# Patient Record
Sex: Female | Born: 1950 | Race: White | Hispanic: No | Marital: Married | Smoking: Current some day smoker
Health system: Southern US, Community
[De-identification: ages and names within clinical notes are randomized; demographics above are authoritative.]

## PROBLEM LIST (undated history)

## (undated) DIAGNOSIS — S92909A Unspecified fracture of unspecified foot, initial encounter for closed fracture: Secondary | ICD-10-CM

## (undated) DIAGNOSIS — E039 Hypothyroidism, unspecified: Secondary | ICD-10-CM

## (undated) DIAGNOSIS — E7800 Pure hypercholesterolemia, unspecified: Secondary | ICD-10-CM

## (undated) DIAGNOSIS — I471 Supraventricular tachycardia, unspecified: Secondary | ICD-10-CM

## (undated) DIAGNOSIS — K5120 Ulcerative (chronic) proctitis without complications: Secondary | ICD-10-CM

## (undated) DIAGNOSIS — J309 Allergic rhinitis, unspecified: Secondary | ICD-10-CM

## (undated) DIAGNOSIS — I10 Essential (primary) hypertension: Secondary | ICD-10-CM

## (undated) DIAGNOSIS — F17200 Nicotine dependence, unspecified, uncomplicated: Secondary | ICD-10-CM

## (undated) DIAGNOSIS — M1990 Unspecified osteoarthritis, unspecified site: Secondary | ICD-10-CM

## (undated) DIAGNOSIS — D649 Anemia, unspecified: Secondary | ICD-10-CM

## (undated) DIAGNOSIS — R079 Chest pain, unspecified: Secondary | ICD-10-CM

## (undated) HISTORY — PX: THYROIDECTOMY: SHX17

## (undated) HISTORY — DX: Hypothyroidism, unspecified: E03.9

## (undated) HISTORY — DX: Chest pain, unspecified: R07.9

## (undated) HISTORY — DX: Supraventricular tachycardia: I47.1

## (undated) HISTORY — DX: Ulcerative (chronic) proctitis without complications: K51.20

## (undated) HISTORY — DX: Pure hypercholesterolemia, unspecified: E78.00

## (undated) HISTORY — DX: Allergic rhinitis, unspecified: J30.9

## (undated) HISTORY — DX: Supraventricular tachycardia, unspecified: I47.10

## (undated) HISTORY — DX: Nicotine dependence, unspecified, uncomplicated: F17.200

## (undated) HISTORY — DX: Essential (primary) hypertension: I10

## (undated) HISTORY — DX: Unspecified fracture of unspecified foot, initial encounter for closed fracture: S92.909A

## (undated) HISTORY — PX: CATARACT EXTRACTION: SUR2

---
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
## 1985-12-13 HISTORY — PX: TUBAL LIGATION: SHX77

## 1998-08-04 ENCOUNTER — Other Ambulatory Visit: Admission: RE | Admit: 1998-08-04 | Discharge: 1998-08-04 | Payer: Self-pay | Admitting: Gastroenterology

## 1999-01-22 ENCOUNTER — Other Ambulatory Visit: Admission: RE | Admit: 1999-01-22 | Discharge: 1999-01-22 | Payer: Self-pay | Admitting: Family Medicine

## 2000-12-13 DIAGNOSIS — S92909A Unspecified fracture of unspecified foot, initial encounter for closed fracture: Secondary | ICD-10-CM

## 2000-12-13 HISTORY — DX: Unspecified fracture of unspecified foot, initial encounter for closed fracture: S92.909A

## 2001-02-15 ENCOUNTER — Encounter: Admission: RE | Admit: 2001-02-15 | Discharge: 2001-02-15 | Payer: Self-pay | Admitting: Family Medicine

## 2001-02-15 ENCOUNTER — Encounter: Payer: Self-pay | Admitting: Family Medicine

## 2002-04-18 ENCOUNTER — Other Ambulatory Visit: Admission: RE | Admit: 2002-04-18 | Discharge: 2002-04-18 | Payer: Self-pay | Admitting: Obstetrics and Gynecology

## 2002-07-05 ENCOUNTER — Encounter: Payer: Self-pay | Admitting: Family Medicine

## 2002-07-05 ENCOUNTER — Encounter: Admission: RE | Admit: 2002-07-05 | Discharge: 2002-07-05 | Payer: Self-pay | Admitting: Family Medicine

## 2003-07-09 ENCOUNTER — Encounter: Admission: RE | Admit: 2003-07-09 | Discharge: 2003-07-09 | Payer: Self-pay | Admitting: Family Medicine

## 2003-07-09 ENCOUNTER — Encounter: Payer: Self-pay | Admitting: Family Medicine

## 2003-10-21 ENCOUNTER — Ambulatory Visit (HOSPITAL_COMMUNITY): Admission: RE | Admit: 2003-10-21 | Discharge: 2003-10-21 | Payer: Self-pay | Admitting: Family Medicine

## 2003-10-22 ENCOUNTER — Ambulatory Visit (HOSPITAL_COMMUNITY): Admission: RE | Admit: 2003-10-22 | Discharge: 2003-10-22 | Payer: Self-pay | Admitting: *Deleted

## 2003-12-14 HISTORY — PX: OOPHORECTOMY: SHX86

## 2003-12-17 ENCOUNTER — Ambulatory Visit: Admission: RE | Admit: 2003-12-17 | Discharge: 2003-12-17 | Payer: Self-pay | Admitting: Gynecology

## 2003-12-24 ENCOUNTER — Encounter (INDEPENDENT_AMBULATORY_CARE_PROVIDER_SITE_OTHER): Payer: Self-pay | Admitting: Specialist

## 2003-12-24 ENCOUNTER — Ambulatory Visit (HOSPITAL_COMMUNITY): Admission: RE | Admit: 2003-12-24 | Discharge: 2003-12-24 | Payer: Self-pay | Admitting: Gynecology

## 2004-01-21 ENCOUNTER — Ambulatory Visit: Admission: RE | Admit: 2004-01-21 | Discharge: 2004-01-21 | Payer: Self-pay | Admitting: Gynecology

## 2004-07-13 ENCOUNTER — Other Ambulatory Visit: Admission: RE | Admit: 2004-07-13 | Discharge: 2004-07-13 | Payer: Self-pay | Admitting: Family Medicine

## 2005-01-21 ENCOUNTER — Ambulatory Visit (HOSPITAL_COMMUNITY): Admission: RE | Admit: 2005-01-21 | Discharge: 2005-01-21 | Payer: Self-pay | Admitting: Family Medicine

## 2005-01-28 ENCOUNTER — Ambulatory Visit: Payer: Self-pay | Admitting: Gastroenterology

## 2005-02-08 ENCOUNTER — Ambulatory Visit: Payer: Self-pay | Admitting: Gastroenterology

## 2006-02-07 ENCOUNTER — Ambulatory Visit (HOSPITAL_COMMUNITY): Admission: RE | Admit: 2006-02-07 | Discharge: 2006-02-07 | Payer: Self-pay | Admitting: Obstetrics and Gynecology

## 2007-09-13 ENCOUNTER — Other Ambulatory Visit: Admission: RE | Admit: 2007-09-13 | Discharge: 2007-09-13 | Payer: Self-pay | Admitting: Family Medicine

## 2008-01-22 ENCOUNTER — Ambulatory Visit (HOSPITAL_COMMUNITY): Admission: RE | Admit: 2008-01-22 | Discharge: 2008-01-22 | Payer: Self-pay | Admitting: Family Medicine

## 2008-09-09 ENCOUNTER — Emergency Department (HOSPITAL_COMMUNITY): Admission: EM | Admit: 2008-09-09 | Discharge: 2008-09-09 | Payer: Self-pay | Admitting: Emergency Medicine

## 2008-09-16 ENCOUNTER — Encounter: Admission: RE | Admit: 2008-09-16 | Discharge: 2008-09-16 | Payer: Self-pay | Admitting: Cardiology

## 2008-12-17 ENCOUNTER — Encounter: Payer: Self-pay | Admitting: Gastroenterology

## 2009-05-26 ENCOUNTER — Telehealth: Payer: Self-pay | Admitting: Gastroenterology

## 2009-07-09 DIAGNOSIS — K5190 Ulcerative colitis, unspecified, without complications: Secondary | ICD-10-CM

## 2009-07-16 ENCOUNTER — Ambulatory Visit: Payer: Self-pay | Admitting: Gastroenterology

## 2009-07-16 DIAGNOSIS — K625 Hemorrhage of anus and rectum: Secondary | ICD-10-CM

## 2009-07-16 DIAGNOSIS — E039 Hypothyroidism, unspecified: Secondary | ICD-10-CM | POA: Insufficient documentation

## 2009-07-16 DIAGNOSIS — K219 Gastro-esophageal reflux disease without esophagitis: Secondary | ICD-10-CM

## 2009-07-16 DIAGNOSIS — R000 Tachycardia, unspecified: Secondary | ICD-10-CM

## 2009-08-11 ENCOUNTER — Ambulatory Visit: Payer: Self-pay | Admitting: Gastroenterology

## 2009-12-13 DIAGNOSIS — M8580 Other specified disorders of bone density and structure, unspecified site: Secondary | ICD-10-CM

## 2009-12-13 HISTORY — DX: Other specified disorders of bone density and structure, unspecified site: M85.80

## 2010-01-13 ENCOUNTER — Ambulatory Visit (HOSPITAL_COMMUNITY): Admission: RE | Admit: 2010-01-13 | Discharge: 2010-01-13 | Payer: Self-pay | Admitting: Family Medicine

## 2010-01-21 ENCOUNTER — Encounter: Admission: RE | Admit: 2010-01-21 | Discharge: 2010-01-21 | Payer: Self-pay | Admitting: Family Medicine

## 2011-04-27 ENCOUNTER — Other Ambulatory Visit (HOSPITAL_COMMUNITY)
Admission: RE | Admit: 2011-04-27 | Discharge: 2011-04-27 | Disposition: A | Discharge status: Home / Self Care | Payer: BC Managed Care – PPO | Source: Ambulatory Visit | Attending: Family Medicine | Admitting: Family Medicine

## 2011-04-27 ENCOUNTER — Other Ambulatory Visit: Payer: Self-pay | Admitting: Family Medicine

## 2011-04-27 DIAGNOSIS — Z124 Encounter for screening for malignant neoplasm of cervix: Secondary | ICD-10-CM | POA: Insufficient documentation

## 2011-04-30 NOTE — Op Note (Signed)
NAME:  Jennifer Sexton, Jennifer Sexton                         ACCOUNT NO.:  [id number]   MEDICAL RECORD NO.:  [id number]                   PATIENT TYPE:  AMB   LOCATION:  DAY                                  FACILITY:  [facility]   PHYSICIAN:  De Blanch, M.D.         DATE OF BIRTH:  29-Mar-1951   DATE OF PROCEDURE:  12/24/2003  DATE OF DISCHARGE:                                 OPERATIVE REPORT   PREOPERATIVE DIAGNOSIS:  Pelvic pain with pelvic mass.   POSTOPERATIVE DIAGNOSIS:  Right ovarian cyst with partial torsion (benign).   OPERATION/PROCEDURE:  Laparoscopic right salpingo-oophorectomy.   SURGEON:  De Blanch, M.D.   ASSISTANT:  Telford Nab, R.N.   ANESTHESIA:  General with orotracheal tube.   ESTIMATED BLOOD LOSS:  25 mL.   SURGICAL FINDINGS:  At the time of laparoscopy, the upper abdomen was  evaluated.  The diaphragm, liver, gallbladder, and stomach appeared normal.  The omentum was normal although there were some adhesions just to the left  of the umbilicus.  In the pelvis, the uterus and left tube and ovary  appeared normal.  The right ovary had a large cyst arising from it,  measuring approximately 9 cm in diameter.  This was not adherent to any  surfaces although there was some torsion of the tube and vascular pedicle.  Frozen section returned showing this to be a benign ovarian cyst.   DESCRIPTION OF PROCEDURE:  The patient was brought to the operating room and  after satisfactory attainment of general anesthesia, placed in a modified  lithotomy position in [place] stirrups.  The anterior abdominal wall, perineum  and vagina were prepped with Betadine.  A Foley catheter was inserted and a  Hulka tenaculum was placed in the uterus and cervix.  The patient was  draped.  The abdomen was entered through an open laparoscopy incision in the  umbilicus with care taken to avoid injury to the bowel.  Adhesions to the  left of the incision were identified and  respected.  A laparoscopic cannula  and laparoscope were inserted and the abdomen insufflated with carbon  dioxide.  The upper abdomen was explored and the patient was placed in steep  Trendelenburg.  The bowel was moved out of pelvis until the pelvic  structures could be identified.  Photographs were taken.  The right pelvic  sidewall was opened, identifying the external iliac artery and vein and the  ureter.  The ovarian vessels were skeletonized, peritoneum was incised just  beneath the ovarian vessels.  A linear stapler was inserted through the  suprapubic cannula and the ovarian vessels divided and ligated with the  linear stapler.  The posterior broad ligament was then incised beneath the  large cyst until we were adjacent to the uterus.  Another stapler was then  applied across the proximal fallopian tube and uterine ovarian anastomosis  and ovarian ligaments.  This was fired thus freeing the tube and ovary and  cyst from any attachments.  In order to remove this cyst intact, a large  EndoCatch bag (15 cm) was inserted through the suprapubic port.  The cyst  was placed in the bag intact and the bag brought to the surface of the skin.  The cyst was then drained using a spinal needle for approximately 240 mL of  straw-colored fluid.  The cyst was then removed inside the bag and submitted  for frozen section which returned this to be a benign ovarian cyst.  The  intraperitoneal pressure was dropped to 5 mmHg and the pedicles reinspected  and were found to be hemostatic.  The suprapubic port fascial incision was  then closed using a Grice needle and 0 Vicryl in a figure-of-eight fashion.  Once this was tied, the fascia and peritoneum were closed and this was an  airtight closure.  The lateral trocars were removed under direct  visualization as was the umbilical trocar.  The umbilical incision was  closed with interrupted sutures of 0 Vicryl.  Skin incisions in all four  ports were closed  with subcuticular sutures of 3-0 Vicryl.  Steri-Strips  were applied.  Dressings were applied.  The patient was awakened from  anesthesia and taken to the recovery room in satisfactory condition.  Sponge, needle and instrument counts were correct x2.                                               De Blanch, M.D.    DC/MEDQ  D:  12/24/2003  T:  12/24/2003  Job:  [id number]   cc:   Guy Sandifer. Arleta Creek, M.D.  [address]  [place]  [place] [address]  Fax: [phone]   Telford Nab, R.N.  [phone] [address]. [address]  [place], [place] [address]

## 2011-04-30 NOTE — Consult Note (Signed)
NAME:  Jennifer Sexton, Jennifer Sexton                         ACCOUNT NO.:  [id number]   MEDICAL RECORD NO.:  [id number]                   PATIENT TYPE:  OUT   LOCATION:  GYN                                  FACILITY:  [facility]   PHYSICIAN:  De Blanch, M.D.         DATE OF BIRTH:  [birth year]   DATE OF CONSULTATION:  12/17/2003  DATE OF DISCHARGE:                                   CONSULTATION   This is a 60 year old white female seen at the request of Dr. Harold Hedge  for management of an ovarian cyst.  The patient initially presented to Dr.  Henderson Cloud after an acute episode of abdominal pain in November of 2004.  This  resolved after approximately 24 hours, and the patient in retrospect thinks  this may have been food poisoning.  In the evaluation of the pain, the  patient underwent a CT scan of the abdomen and pelvis which revealed a  cystic lesion in the right posteroinferior pelvis measuring 8.7 x 7.2 cm.  It was also thought she had a solid mass measuring approximately 3.9 x 3.1  cm in the right fundal portion of the uterus.  Subsequently, the patient  underwent an ultrasound which showed an 8 x 5.4 x 7.3 simple cyst of the  right ovary and a somewhat smaller 1.2 x 2.2 x 1.3 cm irregular cyst of the  right ovary as well.  The left ovary, endometrial stripe, and uterus  appeared normal.   The patient currently denies any pelvic pain or pressure.  She is menopausal  and has not had any bleeding.   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  2. Arthritis.   PAST SURGICAL HISTORY:  1. Partial thyroidectomy in 1977.  2. Bilateral tubal ligation.   DRUG ALLERGIES:  None.   FAMILY HISTORY:  Negative for gynecologic, breast, or colon cancer.   CURRENT MEDICATIONS:  Synthroid and MetroCream for acne rosacea.   SOCIAL HISTORY:  The patient smokes approximately 3 cigarettes a day.  She  works as a [employer] manager.   OBSTETRICAL  HISTORY:  Gravida 2.   REVIEW OF SYSTEMS:  Negative.   PHYSICAL EXAM:  VITAL SIGNS:  Height 5 feet 5, weight 158 pounds, blood  pressure 142/80, pulse 100, respiratory rate 18.  GENERAL:  The patient is a healthy white female in no acute distress.  HEENT:  Negative.  NECK:  Supple.  Without thyromegaly.  There is no supraclavicular or  inguinal adenopathy.  ABDOMEN:  Soft, nontender.  No masses, organomegaly, ascites, or hernias are  noted.  PELVIC:  EGBUS, vagina, bladder, urethra are normal.  Cervix is normal.  Uterus is anterior, normal shape, size, and consistency.  In the right  posterior pelvis is a cystic mass measuring approximately 7 cm in diameter.  RECTOVAGINAL:  Confirms.  The mass is nontender.   IMPRESSION:  Large simple ovarian cyst, most likely  benign cystadenoma or  possibly even a paratubal, parovarian cyst.   The patient is desirous of having the least invasive surgery possible.  Therefore, I would recommend that we approach with the laparoscope in hopes  of being able to remove it intact in an Endobag and then obtain frozen  section.  The patient is aware that if we cannot remove this with the  laparoscope we will proceed with laparotomy.  She is also aware of the  extensive surgery should this be found to be a malignancy.   Finally, the patient wishes to preserve her contralateral (left) ovary as  she believes it may be contributing somewhat to some hormone benefit, and  she is fearful that she may develop hot flushes if the other ovary is  removed.   Surgery is scheduled for December 24, 2003.                                               De Blanch, M.D.    DC/MEDQ  D:  12/18/2003  T:  12/18/2003  Job:  [id number]   cc:   Guy Sandifer. Arleta Creek, M.D.  [address]  [place]  [place] [address]  Fax: [phone]   Telford Nab, R.N.  [phone] [address]. [address]  [place], [place] [address]

## 2011-04-30 NOTE — Consult Note (Signed)
NAME:  Jennifer Sexton, Jennifer Sexton                         ACCOUNT NO.:  [id number]   MEDICAL RECORD NO.:  [id number]                   PATIENT TYPE:  OUT   LOCATION:  GYN                                  FACILITY:  [facility]   PHYSICIAN:  De Blanch, M.D.         DATE OF BIRTH:  December 25, 1950   DATE OF CONSULTATION:  01/21/2004  DATE OF DISCHARGE:                                   CONSULTATION   REASON FOR CONSULTATION:  A 60 year old white female returns for  postoperative check having undergone a laparoscopically assisted right  salpingo-oophorectomy on December 24, 2003.  Final pathology showed the  ovarian cyst to be a serous cyst adenofibroma.  She had an uncomplicated  postoperative course.  Currently, she has no GI or GU symptoms, has no  pelvic pain, pressure, vaginal bleeding, or discharge.   PHYSICAL EXAMINATION:  ABDOMEN:  Soft and nontender.  All laparoscopic  incisions are healing well.  No masses, organomegaly, ascites, or hernias  are noted.  PELVIC:  EGBUS, vagina, bladder, and urethra are normal.  Cervix is normal.  Uterus is anterior, normal size, shape, and consistency.  There are no  adnexal masses.   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  The patient had  desired to keep her other ovary and uterus in situ.  She seems to be doing  well.  We have given her the okay to return to full levels of activity.  She  will return to the care of her primary gynecologist, Dr. Henderson Cloud, and her  primary physician, Dr. Vianne Bulls.                                               De Blanch, M.D.    DC/MEDQ  D:  01/21/2004  T:  01/21/2004  Job:  [id number]   cc:   Guy Sandifer. Arleta Creek, M.D.  [address]  [place]  [place] [address]  Fax: [phone]   C. Duane Lope, M.D.  [address]  [place]  [place] [address]  Fax: [phone]   Telford Nab, R.N.  [phone] [address]  [place], [place] [address]

## 2011-09-13 LAB — POCT I-STAT, CHEM 8
BUN: 7
Calcium, Ion: 1.17
Chloride: 104
Creatinine, Ser: 0.9
Glucose, Bld: 110 — ABNORMAL HIGH
HCT: 44
Hemoglobin: 15
Potassium: 4
Sodium: 142
TCO2: 27

## 2011-12-14 DIAGNOSIS — C801 Malignant (primary) neoplasm, unspecified: Secondary | ICD-10-CM

## 2011-12-14 HISTORY — DX: Malignant (primary) neoplasm, unspecified: C80.1

## 2011-12-14 HISTORY — PX: ROOT CANAL: SHX2363

## 2012-04-17 ENCOUNTER — Other Ambulatory Visit (HOSPITAL_COMMUNITY): Payer: Self-pay | Admitting: Family Medicine

## 2012-04-17 DIAGNOSIS — Z1231 Encounter for screening mammogram for malignant neoplasm of breast: Secondary | ICD-10-CM

## 2012-04-19 ENCOUNTER — Ambulatory Visit (HOSPITAL_COMMUNITY)
Admission: RE | Admit: 2012-04-19 | Discharge: 2012-04-19 | Disposition: A | Discharge status: Home / Self Care | Payer: BC Managed Care – PPO | Source: Ambulatory Visit | Attending: Family Medicine | Admitting: Family Medicine

## 2012-04-19 DIAGNOSIS — Z1231 Encounter for screening mammogram for malignant neoplasm of breast: Secondary | ICD-10-CM

## 2012-12-27 ENCOUNTER — Other Ambulatory Visit: Payer: BC Managed Care – PPO

## 2012-12-27 ENCOUNTER — Other Ambulatory Visit: Payer: Self-pay | Admitting: Family Medicine

## 2012-12-27 DIAGNOSIS — M545 Low back pain: Secondary | ICD-10-CM

## 2013-01-03 ENCOUNTER — Ambulatory Visit
Admission: RE | Admit: 2013-01-03 | Discharge: 2013-01-03 | Disposition: A | Discharge status: Home / Self Care | Payer: BC Managed Care – PPO | Source: Ambulatory Visit | Attending: Family Medicine | Admitting: Family Medicine

## 2013-01-03 DIAGNOSIS — M545 Low back pain: Secondary | ICD-10-CM

## 2013-01-04 ENCOUNTER — Ambulatory Visit: Payer: BC Managed Care – PPO | Attending: Family Medicine

## 2013-01-04 DIAGNOSIS — M25569 Pain in unspecified knee: Secondary | ICD-10-CM | POA: Insufficient documentation

## 2013-01-04 DIAGNOSIS — R5381 Other malaise: Secondary | ICD-10-CM | POA: Insufficient documentation

## 2013-01-04 DIAGNOSIS — M545 Low back pain, unspecified: Secondary | ICD-10-CM | POA: Insufficient documentation

## 2013-01-04 DIAGNOSIS — IMO0001 Reserved for inherently not codable concepts without codable children: Secondary | ICD-10-CM | POA: Insufficient documentation

## 2013-01-08 ENCOUNTER — Ambulatory Visit: Payer: BC Managed Care – PPO | Admitting: Physical Therapy

## 2013-01-11 ENCOUNTER — Ambulatory Visit: Payer: BC Managed Care – PPO | Admitting: Physical Therapy

## 2013-01-15 ENCOUNTER — Ambulatory Visit: Payer: BC Managed Care – PPO | Admitting: Physical Therapy

## 2013-01-15 ENCOUNTER — Encounter: Payer: BC Managed Care – PPO | Admitting: Physical Therapy

## 2013-01-18 ENCOUNTER — Encounter: Payer: BC Managed Care – PPO | Admitting: Physical Therapy

## 2013-01-22 ENCOUNTER — Encounter: Payer: BC Managed Care – PPO | Admitting: Physical Therapy

## 2013-01-25 ENCOUNTER — Encounter: Payer: BC Managed Care – PPO | Admitting: Physical Therapy

## 2013-01-29 ENCOUNTER — Encounter: Payer: BC Managed Care – PPO | Admitting: Physical Therapy

## 2013-02-01 ENCOUNTER — Encounter: Payer: BC Managed Care – PPO | Admitting: Physical Therapy

## 2013-02-05 ENCOUNTER — Encounter: Payer: BC Managed Care – PPO | Admitting: Physical Therapy

## 2013-02-08 ENCOUNTER — Encounter: Payer: BC Managed Care – PPO | Admitting: Physical Therapy

## 2013-02-20 ENCOUNTER — Other Ambulatory Visit: Payer: Self-pay | Admitting: Nurse Practitioner

## 2013-02-20 DIAGNOSIS — C549 Malignant neoplasm of corpus uteri, unspecified: Secondary | ICD-10-CM | POA: Insufficient documentation

## 2013-03-09 ENCOUNTER — Encounter: Payer: Self-pay | Admitting: Gynecologic Oncology

## 2013-03-12 ENCOUNTER — Ambulatory Visit: Payer: BC Managed Care – PPO | Admitting: Gynecology

## 2013-03-12 ENCOUNTER — Telehealth: Payer: Self-pay | Admitting: Gynecologic Oncology

## 2013-03-12 NOTE — Telephone Encounter (Signed)
Consult Note: Gyn-Onc  Consult was requested by Dr.  for the evaluation of Coda W Reder 62 y.o. female  CC: Grade 1 endometrial cancer   Assessment/Plan:  Ms. MARIELENA HARVELL  is a 62 y.o.  year old   Endometrium, biopsy - ENDOMETRIOID ADENOCARCINOMA. Microscopic Comment Although grade is best determined at time of surgical excision, the adenocarcinoma appears FIGO grade I.  HPI:   Current Meds:  Outpatient Encounter Prescriptions as of 03/12/2013  Medication Sig Dispense Refill  . Calcium Carbonate-Vitamin D (CALCIUM + D PO) Take by mouth daily.      Marland Kitchen diltiazem (DILACOR XR) 180 MG 24 hr capsule Take 180 mg by mouth daily.      Marland Kitchen levothyroxine (SYNTHROID, LEVOTHROID) 100 MCG tablet Take 100 mcg by mouth daily. Takes 1/2 on Monday       No facility-administered encounter medications on file as of 03/12/2013.    Allergy: No Known Allergies  Social Hx:   History   Social History  . Marital Status: Married    Spouse Name: N/A    Number of Children: N/A  . Years of Education: N/A   Occupational History  . Not on file.   Social History Main Topics  . Smoking status: Current Every Day Smoker -- 47 years  . Smokeless tobacco: Not on file  . Alcohol Use: Yes     Comment: 2 a day  . Drug Use: No  . Sexually Active: Not on file   Other Topics Concern  . Not on file   Social History Narrative  . No narrative on file    Past Surgical Hx:  Past Surgical History  Procedure Laterality Date  . Thyroidectomy    BSO R ovarian fibroma left ovarian serous cyst.2005  Past Medical Hx:  Past Medical History  Diagnosis Date  . Chest pain   . Paroxysmal supraventricular tachycardia   . Tobacco dependence   . Ulcerative proctitis   . Hypothyroidism   . Allergic rhinitis   . Elevated LDL cholesterol level   . Hypertension     Past Gynecological History:   Family Hx: No family history on file.  Review of Systems:  Constitutional  Feels well,   Skin/Breast  No  rash, sores, jaundice, itching, dryness Cardiovascular  No chest pain, shortness of breath, or edema  Pulmonary  No cough or wheeze.  Gastro Intestinal  No nausea, vomitting, or diarrhoea. No bright red blood per rectum, no abdominal pain, change in bowel movement, or constipation.  Genito Urinary  No frequency, urgency, dysuria,  Musculo Skeletal  No myalgia, arthralgia, joint swelling or pain  Neurologic  No weakness, numbness, change in gait,  Psychology  No depression, anxiety, insomnia.   Vitals:  @v @  Physical Exam: WD in NAD Neck  Supple NROM, without any enlargements.  Lymph Node Survey No cervical supraclavicular or inguinal adenopathy Cardiovascular  Pulse normal rate, regularity and rhythm. S1 and S2 normal.  Lungs  Clear to auscultation bilateraly, without wheezes/crackles/rhonchi. Good air movement.  Skin  No rash/lesions/breakdown  Psychiatry  Alert and oriented to person, place, and time  Abdomen  Normoactive bowel sounds, abdomen soft, non-tender and obese. Surgical  sites intact without evidence of hernia.  Back No CVA tenderness Genito Urinary  Vulva/vagina: Normal external female genitalia.  No lesions. No discharge or bleeding.  Bladder/urethra:  No lesions or masses  Vagina:   Cervix: Normal appearing, no lesions.  Uterus: Small, mobile, no parametrial involvement or nodularity.  Adnexa:  No palpable masses. Rectal  Good tone, no masses no cul de sac nodularity.  Extremities  No bilateral cyanosis, clubbing or edema.   Laurette Schimke, MD, PhD 03/12/2013, 9:06 PM

## 2013-03-13 ENCOUNTER — Encounter: Payer: Self-pay | Admitting: Gynecologic Oncology

## 2013-03-13 ENCOUNTER — Ambulatory Visit: Payer: BC Managed Care – PPO | Attending: Gynecologic Oncology | Admitting: Gynecologic Oncology

## 2013-03-13 VITALS — BP 112/70 | HR 64 | Temp 98.6°F | Resp 16 | Ht 64.29 in | Wt 163.6 lb

## 2013-03-13 DIAGNOSIS — C549 Malignant neoplasm of corpus uteri, unspecified: Secondary | ICD-10-CM

## 2013-03-13 DIAGNOSIS — C541 Malignant neoplasm of endometrium: Secondary | ICD-10-CM

## 2013-03-13 NOTE — Progress Notes (Signed)
Consult Note: Gyn-Onc  Consult was requested by Dr. Ermalinda Memos for the evaluation of Jennifer Sexton 62 y.o. female  CC: Grade 1 endometrial cancer   Assessment/Plan: [name] is a 62 year old gravida 2 para 2 with an endometrial biopsy indicating the presence of a grade 1 endometrioid adenocarcinoma. An ultrasound obtained  The same day as the endometrial biopsy demonstrated a uterus 7.8 cm in length.   A detailed discussion was held with the patient  with regard to to her endometrial cancer diagnosis. We discussed the standard management options for uterine cancer which includes surgery followed possibly by adjuvant therapy depending on the results of surgery. The options for surgical management include a hysterectomy and removal of the tubes and ovaries possibly with removal of pelvic lymph nodes. A minimally invasive approach including a robotic hysterectomy or laparoscopic hysterectomy have benefits including shorter hospital stay, recovery time and better wound healing. The alternative approach is an open hysterectomy. The patient has been counseled about these surgical options and the risks of surgery in general including infection, bleeding, damage to surrounding structures (including bowel, bladder, ureters, nerves or vessels), and the postoperative risks of PE/ DVT, and lymphedema. I extensively reviewed the additional risks of robotic hysterectomy including possible need for conversion to open laparotomy.  I discussed positioning during surgery of trendelenberg and risks of minor facial swelling and care we take in preoperative positioning.  After counseling and consideration of her options, she desires to proceed with robotic endometrial cancer staging. The endometrial cancer specimen will be sent for microsatellite instability and IHC staining particularly given her family history.  The patient is aware that she will be contacted regarding a surgery date and that either I or Dr.  Cleda Mccreedy be her surgeon. At her preference of the procedure be performed after April 15.  She will be seen by anesthesia for preoperative clearance and discussion of postoperative pain management.  She was given the opportunity to ask questions, which were answered to her satisfaction, and she is agreement with the above mentioned plan of care.   HPI: Jennifer Sexton  is a 63 y.o.  year old who in February of 2014 noted vaginal bleeding. An ultrasound was performed on 02/20/2013 and this demonstrated a uterus that measures 7.3 cm in length. The endometrium was noted to be thickened and irregular in appearance and measuring 1.9 cm. She underwent an endometrial biopsy on 02/21/2012   Endometrium, biopsy - ENDOMETRIOID ADENOCARCINOMA. Microscopic Comment Although grade is best determined at time of surgical excision, the adenocarcinoma appears FIGO grade I.  Ms. Bookwalter reports occasional vaginal discharge. She denies any weight loss but does report some abdominal tightness and bloating. Reports 2 episodes of diarrhea but denies any recent weight loss changes in her bowel or rectal habits or hematuria.  Her history is remarkable for a father who was diagnosed with colon cancer the age of 74 and died one year later.   Current Meds:  Outpatient Encounter Prescriptions as of 03/13/2013  Medication Sig Dispense Refill  . Calcium Carbonate-Vitamin D (CALCIUM + D PO) Take by mouth daily.      Marland Kitchen diltiazem (DILACOR XR) 180 MG 24 hr capsule Take 180 mg by mouth daily.      Marland Kitchen levothyroxine (SYNTHROID, LEVOTHROID) 100 MCG tablet Take 100 mcg by mouth daily. Takes 1/2 on Monday       No facility-administered encounter medications on file as of 03/13/2013.    Allergy: No Known Allergies  Social Hx:   History   Social History  . Marital Status: Married    Spouse Name: N/A    Number of Children: N/A  . Years of Education: N/A   Occupational History  . Not on file.   Social History Main Topics   . Smoking status: Current Some Day Smoker -- 0.00 packs/day for 47 years  . Smokeless tobacco: Not on file  . Alcohol Use: Yes     Comment: 2 a day  . Drug Use: No  . Sexually Active: Not on file     Comment: 0.5 pack a week   Other Topics Concern  . Not on file   Social History Narrative  . No narrative on file    Past Surgical Hx:  Past Surgical History  Procedure Laterality Date  . Thyroidectomy    . Oophorectomy  2005  . Tonsillectomy  1969  . Wisdom tooth extraction  1969  . Tubal ligation  1987  . Cataract extraction      2008-right eye, 2010- left eye  . Root canal  2013  RSO R ovarian fibroma .2005  Past Medical Hx:  Past Medical History  Diagnosis Date  . Chest pain   . Paroxysmal supraventricular tachycardia   . Tobacco dependence   . Ulcerative proctitis   . Hypothyroidism   . Allergic rhinitis   . Elevated LDL cholesterol level   . Hypertension   . Broken foot 2002    left    Past Gynecological History: G2 P2 menarche at age of 46 with regular menses menopause occurred in 2002 reports at least 10 years use of oral contraceptive pills  Family Hx:  Family History  Problem Relation Age of Onset  . Cerebral aneurysm Mother   . Colon cancer Father     Review of Systems:  Constitutional  Feels well,   Skin/Breast  No rash, sores, jaundice, itching, dryness Cardiovascular  No chest pain, shortness of breath, or edema  Pulmonary  No cough or wheeze.  Gastro Intestinal  No nausea, vomitting, or diarrhoea. No bright red blood per rectum, no abdominal pain, change in bowel movement, Genito Urinary  No frequency, urgency, dysuria,  Musculo Skeletal  No myalgia, arthralgia, joint swelling or pain  Neurologic  No weakness, numbness, change in gait,  Psychology  No depression, reprots significant "concern"   Vitals: BP 112/70  Pulse 64  Temp(Src) 98.6 F (37 C)  Resp 16  Ht 5' 4.29" (1.633 m)  Wt 163 lb 9.6 oz (74.208 kg)  BMI 27.83  kg/m2  Physical Exam: WD in NAD Neck  Supple NROM, without any enlargements.  Lymph Node Survey No cervical supraclavicular or inguinal adenopathy Cardiovascular  Pulse normal rate, regularity and rhythm. S1 and S2 normal.  Lungs  Clear to auscultation bilateraly, without wheezes/crackles/rhonchi. Good air movement.  Psychiatry  Alert and oriented to person, place, and time  Abdomen  Normoactive bowel sounds, abdomen soft, non-tender and obese.  Back No CVA tenderness Genito Urinary  Vulva/vagina: Normal external female genitalia.  No lesions. No discharge or bleeding.  Bladder/urethra:  No lesions or masses  Vagina: atrophic, no bleeding or discharge  Cervix: Normal appearing, no lesions.  Uterus: Small, mobile, no parametrial involvement or nodularity.  Adnexa: No palpable masses. Rectal  Good tone, no masses no cul de sac nodularity.  Extremities  No bilateral cyanosis, clubbing or edema.   Laurette Schimke, MD, PhD 03/13/2013, 12:34 PM

## 2013-03-13 NOTE — Patient Instructions (Addendum)
Thank you very much [name] for allowing me to provide care for you today.  I appreciate your confidence in choosing our Gynecologic Oncology team.  If you have any questions about your visit today please call our office and we will get back to you as soon as possible.  Jennifer Sexton. Jennifer Kreiter MD., PhD Gynecologic Oncology  Cancer of the Uterus The uterus is part of a woman's reproductive system. It is the hollow, pear-shaped organ where a baby grows. The uterus is in the pelvis between the bladder and the rectum. The narrow, lower portion of the uterus is the cervix. The fallopian tubes extend from either side of the top of the uterus to the ovaries. The wall of the uterus has two layers of tissue. The inner layer, or lining, is the endometrium. The outer layer is muscle tissue called the myometrium. In women of childbearing age, the lining of the uterus grows and thickens each month to prepare for pregnancy. If a woman does not become pregnant, the thick, bloody lining flows out of the body through the vagina. This flow is called menstruation. TYPES OF UTERINE CANCER  The most common type of cancer of the uterus begins in the lining (endometrium). It is called endometrial cancer, uterine cancer, or cancer of the uterus. It is seen in 2% to 3% of women.  A different type of cancer, uterine sarcoma, develops in the muscle (myometrium). Cancer that begins in the cervix is also a different type of cancer.  Rarely, a noncancerous fibroid tumor of the uterus develops into a sarcoma. CAUSES  No one knows the exact causes of uterine cancer. But it is clear that this disease is not contagious. No one can "catch" cancer from another person. Women who get this disease are more likely than other women to have certain risk factors. A risk factor is something that increases a person's chance of developing the disease.  Most women who have known risk factors do not get uterine cancer. On the other  hand, many who do get this disease have none of these factors. Doctors can seldom explain why one woman gets uterine cancer and another does not.  Studies have found the following risk factors:  Age. Cancer of the uterus occurs mostly in women over age 65.  Endometrial hyperplasia (enlarged endometrium). The risk of uterine cancer is higher if a woman has endometrial hyperplasia.  Hormone replacement therapy (HRT). HRT is used to control the symptoms of menopause, to prevent osteoporosis (thinning of the bones), and to reduce the risk of heart disease or stroke. Women who still have their uterus, and use estrogen without progesterone, have an increased risk of uterine cancer. Long-term use and large doses of estrogen seem to increase this risk. Women who use a combination of estrogen and progesterone have a lower risk of uterine cancer than women who use estrogen alone. The progesterone protects the uterus from developing cancer.  Obesity and related conditions. The body stores and releases some of its estrogen in fatty tissue. That is why obese women are more likely than thin women to have higher levels of estrogen in their bodies. High levels of estrogen may be the reason that obese women have an increased risk of developing uterine cancer. The risk of this disease is also higher in women with diabetes or high blood pressure. These conditions occur in many obese women.  Tamoxifen. Women taking the drug tamoxifen to prevent or treat breast cancer have an increased  risk of uterine cancer. This risk appears to be related to the estrogen-like effect of this drug on the uterus.  Race. White women are more likely than African-American women to get uterine cancer.  Colorectal cancer. Women who have had an inherited form of colorectal cancer have a higher risk of developing uterine cancer than other women.  Infertility.  Beginning menstrual periods before age 84.  Having menstrual periods after age  75.  History of cancer of the ovary or intestine.  Family history of uterine cancer.  Having diabetes, high blood pressure, thyroid or gallbladder disease.  Long-term use of high does of birth control pills. Birth control pills today are low in hormone doses.  Radiation to the abdomen or pelvis.  Smoking. SYMPTOMS  Uterine cancer usually occurs after menopause. But it may also occur around the time that menopause begins. Abnormal vaginal bleeding is the most common symptom of uterine cancer. Bleeding may start as a watery, blood-streaked flow that gradually contains more blood. Women should not assume that abnormal vaginal bleeding is part of menopause. A woman should see her caregiver if she has any of the following symptoms:  Unusual vaginal bleeding or discharge.  Difficult or painful urination.  Pain during intercourse.  Pain in the pelvic area.  Increased girth (growth) of the stomach.  Any vaginal bleeding after menopause.  Unexplained weight loss. These symptoms can be caused by cancer or other less serious conditions. Most often they are not cancer. But a thorough evaluation is needed to be certain.  STAGING   If uterine cancer is diagnosed, the caregiver needs to know the stage, or extent, of the disease to plan the best treatment. Staging is a careful attempt to find out whether the cancer has spread, and if so, to what parts of the body.  When uterine cancer spreads (metastasizes) outside the uterus, cancer cells are often found in nearby lymph nodes, nerves, or blood vessels. If the cancer has reached the lymph nodes, cancer cells may have spread to other lymph nodes and other organs of the body.  Staging is done at the time of surgery. In most cases, the most reliable way to stage this disease is to remove the uterus, cervix, tubes, ovaries, and lymph nodes. A pathologist uses a microscope to examine the uterus and other tissues removed by the surgeon, to determine  the extent of the cancer in the pelvis.  If lymph nodes have cancer cells, other parts of the body are examined, to see if it has spread to other organs. MAIN FEATURES OF EACH STAGE OF THE DISEASE: Stage I. The cancer is only in the body of the uterus. It is not in the cervix. Stage II. The cancer has spread from the body of the uterus to the cervix. Stage III. The cancer has spread outside the uterus, but not outside the pelvis (and not to the bladder or rectum). Lymph nodes in the pelvis may contain cancer cells. Stage IV. The cancer has spread into the bladder or rectum. It may have spread beyond the pelvis to other body parts. TREATMENT  Women with uterine cancer have many treatment options. Most women with uterine cancer are treated with surgery. Some have radiation or chemotherapy. A smaller number of women may be treated with hormonal therapy. Some patients receive a combination of therapies. You may want to consult with another cancer doctor for a second opinion. The caregiver (usually a cancer doctor) is the best person to describe your treatment choices and  to discuss the expected results of treatment. SURGERY  Most women with uterine cancer have surgery to remove the uterus, cervix, tubes, and ovaries (total hysterectomy). This is usually done through an incision in the abdomen.  The doctor may also remove the lymph nodes near the tumor, to see if they contain cancer. If cancer cells have reached the lymph nodes, it may mean that the disease has spread to other parts of the body. If cancer cells have not spread beyond the endometrium, the woman may not need to have any other treatment. The length of the hospital stay may vary from several days to a week. RADIATION THERAPY  In radiation therapy, high-energy rays are used to kill cancer cells. Like surgery, radiation therapy is a local therapy. It affects cancer cells only in the treated area.  Some women with Stage I, II, or III uterine  cancer need both radiation therapy and surgery. They may have radiation before surgery to shrink the tumor, or after surgery to destroy any cancer cells that remain in the area. The doctor may suggest radiation treatments for the small number of women who cannot have surgery.  Doctors use two types of radiation therapy to treat uterine cancer:  External radiation. In external radiation therapy, a large machine outside the body is used to aim radiation at the tumor area. The woman usually does not stay overnight (outpatient) at the hospital or clinic, and receives external radiation 5 days a week for several weeks. This schedule helps protect healthy cells and tissue by spreading out the total dose of radiation. No radioactive materials are put into the body for external radiation therapy.  Internal radiation. In internal radiation therapy, tiny tubes containing a radioactive substance are inserted through the vagina and cervix, into the uterus, and left in place for a few days. The woman stays in the hospital during this treatment. To protect others from radiation exposure, the patient may not be able to have visitors or may have visitors only for a short period of time while the implant is in place. Once the implant is removed, the woman has no radioactivity in her body.  Some patients need both external and internal radiation therapies. CHEMOTHERAPY Chemotherapy is not usually used for endometrial cancer of the uterus. However, with sarcoma of the uterus or of the fibroid, it may be used in combination with surgery. Chemotherapy may also be used with recurring sarcoma, and in patients who cannot have surgery. HORMONE THERAPY Hormonal therapy involves substances that prevent cancer cells from multiplying or growing by attaching to hormone receptors. This causes changes in cancer cells. Before therapy begins, the caregiver may request a hormone receptor test. This special lab test of uterine tissue helps  the caregiver learn if estrogen and progesterone receptors are present. If the tissue has receptors, the woman is more likely to respond to hormonal therapy.  Hormonal therapy is called a systemic therapy, because it can affect cancer cells throughout the body. Usually, hormonal therapy is a type of progesterone, taken as a pill or injection.  The doctor may use hormonal therapy for women with uterine cancer who are unable to have surgery or radiation therapy. Also, the doctor may give hormonal therapy to women with uterine cancer that has spread to the lungs or other distant sites. It is also given to women with uterine cancer that has come back.  Hormonal therapy can cause a number of side effects. Women taking progesterone may retain fluid, have an increased appetite,  and gain weight. Women who are still menstruating may have changes in their periods.  Hormone therapy can be used in combination with surgery or radiation. HOME CARE INSTRUCTIONS   Maintain a normal weight with a healthy balanced diet and exercise.  If you have diabetes, high blood pressure, thyroid or gallbladder disease, keep them in control with your caregiver's treatment and recommendations.  Do not smoke.  Do not take estrogen without taking progesterone with it, for menopausal symptoms.  Join a support group or get counseling, if you would like help dealing with your cancer.  If you are on hormone replacement therapy, see your caregiver as recommended, and be informed about the side effects of HRT.  Women with known risk factors should ask their caregiver what symptoms to look for and how often they should have an examination.  Keep your follow-up appointments and take your medicines as advised.  Write your questions down, and take them with you to your caregiver's appointments.  You may want another person to be with you for your appointments, so you do not miss any instructions. SEEK MEDICAL CARE IF:   You  have any abnormal vaginal bleeding.  You are having menstrual periods at the age of 21 or older.  You have bleeding after sexual intercourse.  You are taking tomoxifen and develop vaginal bleeding.  Your stomach is growing, and you are not pregnant.  You have pain with sexual intercourse.  You have stomach or pelvis pain.  You have weight loss for no known reason.  You have pain or difficulty with urination. [facility]  Cancer Information Service (CIS) provides accurate, up-to-date information on cancer to patients and their families, health professionals, and the general public:  Phone: 1-800-4-CANCER ((367)391-4694).  Internet: http://www.cancer.gov NCI's website contains complete information about cancer causes and prevention, screening and diagnosis, treatment and survivorship, clinical trials, statistics, funding, training, and employment opportunities, and [employer] and its programs. CLINICAL TRIALS A woman who is interested in being part of a clinical trial should talk with her caregiver. NCI's website (http://www.johnson-fowler.biz/) provides general information about clinical trials. It also offers detailed information about specific ongoing studies of uterine cancer by linking to PDQ, a cancer information database developed by the NCI. The Cancer Information Service at 1-800-4-CANCER can answer questions about cancer and provide information from the PDQ database. Document Released: 11/29/2005 Document Revised: 02/21/2012 Document Reviewed: 10/02/2009 [facility] Patient Information 2013 [place], [place].

## 2013-03-19 NOTE — Progress Notes (Deleted)
Surgery scheduled for 04/03/13.  Need orders placed in EPIC.  Preop appointment on 03/29/13 at 0900am.  Thanks.

## 2013-03-21 ENCOUNTER — Encounter (HOSPITAL_COMMUNITY): Payer: Self-pay | Admitting: Pharmacy Technician

## 2013-03-28 NOTE — Patient Instructions (Signed)
Jennifer Sexton  03/28/2013   Your procedure is scheduled on:  04/03/13   Report to [facility] at    0515  AM.  Call this number if you have problems the morning of surgery: [phone]   Remember:   Do not eat food or drink liquids after midnight.   Take these medicines the morning of surgery with A SIP OF WATER:    Do not wear jewelry, make-up or nail polish.  Do not wear lotions, powders, or perfumes.   Do not shave 48 hours prior to surgery.   Do not bring valuables to the hospital.  Contacts, dentures or bridgework may not be worn into surgery.  Leave suitcase in the car. After surgery it may be brought to your room.  For patients admitted to the hospital, checkout time is 11:00 AM the day of  discharge.      SEE CHG INSTRUCTION SHEET    Please read over the following fact sheets that you were given: MRSA Information, coughing and deep breathing exercises, leg exercises, Blood Transfusion Fact Sheet                Failure to comply with these instructions may result in cancellation of your surgery.                Patient Signature ____________________________              Nurse Signature _____________________________

## 2013-03-29 ENCOUNTER — Encounter (HOSPITAL_COMMUNITY): Payer: Self-pay

## 2013-03-29 ENCOUNTER — Encounter (HOSPITAL_COMMUNITY)
Admission: RE | Admit: 2013-03-29 | Discharge: 2013-03-29 | Disposition: A | Discharge status: Home / Self Care | Payer: BC Managed Care – PPO | Source: Ambulatory Visit | Attending: Gynecologic Oncology | Admitting: Gynecologic Oncology

## 2013-03-29 ENCOUNTER — Telehealth: Payer: Self-pay | Admitting: *Deleted

## 2013-03-29 ENCOUNTER — Ambulatory Visit (HOSPITAL_COMMUNITY)
Admission: RE | Admit: 2013-03-29 | Discharge: 2013-03-29 | Disposition: A | Discharge status: Home / Self Care | Payer: BC Managed Care – PPO | Source: Ambulatory Visit | Attending: Gynecologic Oncology | Admitting: Gynecologic Oncology

## 2013-03-29 DIAGNOSIS — Z01818 Encounter for other preprocedural examination: Secondary | ICD-10-CM | POA: Insufficient documentation

## 2013-03-29 DIAGNOSIS — Z01812 Encounter for preprocedural laboratory examination: Secondary | ICD-10-CM | POA: Insufficient documentation

## 2013-03-29 DIAGNOSIS — C549 Malignant neoplasm of corpus uteri, unspecified: Secondary | ICD-10-CM | POA: Insufficient documentation

## 2013-03-29 HISTORY — DX: Anemia, unspecified: D64.9

## 2013-03-29 HISTORY — DX: Unspecified osteoarthritis, unspecified site: M19.90

## 2013-03-29 LAB — COMPREHENSIVE METABOLIC PANEL
ALT: 11 U/L (ref 0–35)
Alkaline Phosphatase: 53 U/L (ref 39–117)
BUN: 9 mg/dL (ref 6–23)
CO2: 30 mEq/L (ref 19–32)
Chloride: 103 mEq/L (ref 96–112)
GFR calc Af Amer: >90 mL/min (ref >90)
GFR calc non Af Amer: >90 mL/min (ref >90)
Glucose, Bld: 106 mg/dL — ABNORMAL HIGH (ref 70–99)
Potassium: 4.4 mEq/L (ref 3.5–5.1)
Total Bilirubin: 0.3 mg/dL (ref 0.3–1.2)

## 2013-03-29 LAB — CBC WITH DIFFERENTIAL/PLATELET
Eosinophils Absolute: 0.1 10*3/uL (ref 0.0–0.7)
Hemoglobin: 13.7 g/dL (ref 12.0–15.0)
Lymphocytes Relative: 28 % (ref 12–46)
Lymphs Abs: 1.4 10*3/uL (ref 0.7–4.0)
MCH: 32.1 pg (ref 26.0–34.0)
Monocytes Relative: 8 % (ref 3–12)
Neutro Abs: 3 10*3/uL (ref 1.7–7.7)
Neutrophils Relative %: 62 % (ref 43–77)
RBC: 4.27 MIL/uL (ref 3.87–5.11)
WBC: 4.9 10*3/uL (ref 4.0–10.5)

## 2013-03-29 NOTE — Progress Notes (Signed)
Last office visit with Dr Anne Fu on chart done 05/10/12.  EKG 05/10/12 on chart

## 2013-03-29 NOTE — Progress Notes (Signed)
Dr Nelly Rout notified of 2V CXR results done 03/29/13.  Spoke with MD by phone.

## 2013-03-29 NOTE — Telephone Encounter (Signed)
THIS REPORT WAS CALLED AND FAXED TO TRIAGE. THE REPORT WAS GIVEN TO DR.BREWSTER.

## 2013-04-02 ENCOUNTER — Encounter: Payer: Self-pay | Admitting: Gynecologic Oncology

## 2013-04-02 NOTE — Anesthesia Preprocedure Evaluation (Addendum)
Anesthesia Evaluation  Patient identified by MRN, date of birth, ID band Patient awake    Reviewed: Allergy & Precautions, H&P , NPO status , Patient's Chart, lab work & pertinent test results  Airway Mallampati: II TM Distance: >3 FB Neck ROM: Full    Dental no notable dental hx.    Pulmonary Current Smoker,  breath sounds clear to auscultation  Pulmonary exam normal       Cardiovascular hypertension, Pt. on medications negative cardio ROS  + dysrhythmias Supra Ventricular Tachycardia Rhythm:Regular Rate:Normal  One episode of SVT. Note from Dr. Anne Fu from May 2013.   Neuro/Psych negative neurological ROS  negative psych ROS   GI/Hepatic Neg liver ROS, PUD, GERD-  ,  Endo/Other  Hypothyroidism   Renal/GU negative Renal ROS  negative genitourinary   Musculoskeletal negative musculoskeletal ROS (+)   Abdominal   Peds negative pediatric ROS (+)  Hematology negative hematology ROS (+)   Anesthesia Other Findings   Reproductive/Obstetrics negative OB ROS                          Anesthesia Physical Anesthesia Plan  ASA: II  Anesthesia Plan: General   Post-op Pain Management:    Induction: Intravenous  Airway Management Planned: Oral ETT  Additional Equipment:   Intra-op Plan:   Post-operative Plan: Extubation in OR  Informed Consent: I have reviewed the patients History and Physical, chart, labs and discussed the procedure including the risks, benefits and alternatives for the proposed anesthesia with the patient or authorized representative who has indicated his/her understanding and acceptance.   Dental advisory given  Plan Discussed with: CRNA  Anesthesia Plan Comments:         Anesthesia Quick Evaluation

## 2013-04-03 ENCOUNTER — Ambulatory Visit (HOSPITAL_COMMUNITY): Payer: BC Managed Care – PPO | Admitting: Anesthesiology

## 2013-04-03 ENCOUNTER — Encounter (HOSPITAL_COMMUNITY): Payer: Self-pay | Admitting: *Deleted

## 2013-04-03 ENCOUNTER — Encounter (HOSPITAL_COMMUNITY): Payer: Self-pay | Admitting: Anesthesiology

## 2013-04-03 ENCOUNTER — Ambulatory Visit (HOSPITAL_COMMUNITY)
Admission: RE | Admit: 2013-04-03 | Discharge: 2013-04-04 | Disposition: A | Discharge status: Home / Self Care | Payer: BC Managed Care – PPO | Source: Ambulatory Visit | Attending: Obstetrics & Gynecology | Admitting: Obstetrics & Gynecology

## 2013-04-03 ENCOUNTER — Encounter (HOSPITAL_COMMUNITY)
Admission: RE | Disposition: A | Discharge status: Home / Self Care | Payer: Self-pay | Source: Ambulatory Visit | Attending: Obstetrics & Gynecology

## 2013-04-03 DIAGNOSIS — C549 Malignant neoplasm of corpus uteri, unspecified: Secondary | ICD-10-CM | POA: Insufficient documentation

## 2013-04-03 DIAGNOSIS — Z79899 Other long term (current) drug therapy: Secondary | ICD-10-CM | POA: Insufficient documentation

## 2013-04-03 HISTORY — PX: LYMPH NODE DISSECTION: SHX5087

## 2013-04-03 HISTORY — PX: ROBOTIC ASSISTED TOTAL HYSTERECTOMY WITH BILATERAL SALPINGO OOPHERECTOMY: SHX6086

## 2013-04-03 LAB — TYPE AND SCREEN: ABO/RH(D): O POS

## 2013-04-03 SURGERY — ROBOTIC ASSISTED TOTAL HYSTERECTOMY WITH BILATERAL SALPINGO OOPHORECTOMY
Anesthesia: General | Wound class: Clean Contaminated

## 2013-04-03 MED ORDER — CEFAZOLIN SODIUM-DEXTROSE 2-3 GM-% IV SOLR
2.0000 g | INTRAVENOUS | Status: AC
  Administered 2013-04-03: 2 g via INTRAVENOUS

## 2013-04-03 MED ORDER — LACTATED RINGERS IR SOLN
Status: DC | PRN
  Administered 2013-04-03: 1000 mL

## 2013-04-03 MED ORDER — LACTATED RINGERS IV SOLN
INTRAVENOUS | Status: DC | PRN
  Administered 2013-04-03 (×3): via INTRAVENOUS

## 2013-04-03 MED ORDER — LEVOTHYROXINE SODIUM 50 MCG PO TABS: 50.0000 ug | ORAL_TABLET | ORAL | Status: DC

## 2013-04-03 MED ORDER — ENOXAPARIN SODIUM 40 MG/0.4ML ~~LOC~~ SOLN
40.0000 mg | SUBCUTANEOUS | Status: AC
  Administered 2013-04-03: 40 mg via SUBCUTANEOUS
  Filled 2013-04-03: qty 0.4

## 2013-04-03 MED ORDER — BUPIVACAINE HCL 0.25 % IJ SOLN
INTRAMUSCULAR | Status: DC | PRN
  Administered 2013-04-03: 8 mL

## 2013-04-03 MED ORDER — DEXAMETHASONE SODIUM PHOSPHATE 10 MG/ML IJ SOLN
INTRAMUSCULAR | Status: DC | PRN
  Administered 2013-04-03: 8 mg via INTRAVENOUS

## 2013-04-03 MED ORDER — OXYCODONE-ACETAMINOPHEN 5-325 MG PO TABS: 1.0000 | ORAL_TABLET | ORAL | Status: DC | PRN

## 2013-04-03 MED ORDER — KCL IN DEXTROSE-NACL 20-5-0.45 MEQ/L-%-% IV SOLN
INTRAVENOUS | Status: AC
  Filled 2013-04-03: qty 1000

## 2013-04-03 MED ORDER — ONDANSETRON HCL 4 MG/2ML IJ SOLN: 4.0000 mg | Freq: Four times a day (QID) | INTRAMUSCULAR | Status: DC | PRN

## 2013-04-03 MED ORDER — DILTIAZEM HCL ER COATED BEADS 180 MG PO CP24
180.0000 mg | ORAL_CAPSULE | Freq: Every day | ORAL | Status: DC
  Administered 2013-04-03 – 2013-04-04 (×2): 180 mg via ORAL
  Filled 2013-04-03 (×2): qty 1

## 2013-04-03 MED ORDER — SUFENTANIL CITRATE 50 MCG/ML IV SOLN
INTRAVENOUS | Status: DC | PRN
  Administered 2013-04-03 (×4): 10 ug via INTRAVENOUS
  Administered 2013-04-03: 5 ug via INTRAVENOUS
  Administered 2013-04-03: 15 ug via INTRAVENOUS

## 2013-04-03 MED ORDER — PROPOFOL 10 MG/ML IV BOLUS
INTRAVENOUS | Status: DC | PRN
  Administered 2013-04-03: 40 mg via INTRAVENOUS
  Administered 2013-04-03: 200 mg via INTRAVENOUS

## 2013-04-03 MED ORDER — NEOSTIGMINE METHYLSULFATE 1 MG/ML IJ SOLN
INTRAMUSCULAR | Status: DC | PRN
  Administered 2013-04-03: 4 mg via INTRAVENOUS

## 2013-04-03 MED ORDER — BUPIVACAINE HCL (PF) 0.25 % IJ SOLN
INTRAMUSCULAR | Status: AC
  Filled 2013-04-03: qty 30

## 2013-04-03 MED ORDER — ENOXAPARIN SODIUM 40 MG/0.4ML ~~LOC~~ SOLN
40.0000 mg | SUBCUTANEOUS | Status: DC
Start: 2013-04-04 — End: 2013-04-04
  Administered 2013-04-04: 40 mg via SUBCUTANEOUS
  Filled 2013-04-03 (×2): qty 0.4

## 2013-04-03 MED ORDER — PROMETHAZINE HCL 25 MG/ML IJ SOLN: 6.2500 mg | INTRAMUSCULAR | Status: DC | PRN

## 2013-04-03 MED ORDER — HYDROMORPHONE HCL PF 1 MG/ML IJ SOLN: 0.5000 mg | INTRAMUSCULAR | Status: DC | PRN

## 2013-04-03 MED ORDER — LEVOTHYROXINE SODIUM 50 MCG PO TABS
100.0000 ug | ORAL_TABLET | ORAL | Status: DC
  Filled 2013-04-03 (×2): qty 2

## 2013-04-03 MED ORDER — KETOROLAC TROMETHAMINE 30 MG/ML IJ SOLN
30.0000 mg | Freq: Four times a day (QID) | INTRAMUSCULAR | Status: DC
  Administered 2013-04-03 – 2013-04-04 (×4): 30 mg via INTRAVENOUS
  Filled 2013-04-03 (×8): qty 1

## 2013-04-03 MED ORDER — CEFAZOLIN SODIUM-DEXTROSE 2-3 GM-% IV SOLR
INTRAVENOUS | Status: AC
  Filled 2013-04-03: qty 50

## 2013-04-03 MED ORDER — LEVOTHYROXINE SODIUM 100 MCG PO TABS
100.0000 ug | ORAL_TABLET | ORAL | Status: DC
  Administered 2013-04-03 – 2013-04-04 (×2): 100 ug via ORAL
  Filled 2013-04-03 (×4): qty 1

## 2013-04-03 MED ORDER — STERILE WATER FOR IRRIGATION IR SOLN
Status: DC | PRN
  Administered 2013-04-03: 3000 mL

## 2013-04-03 MED ORDER — HYDROMORPHONE HCL PF 1 MG/ML IJ SOLN
INTRAMUSCULAR | Status: DC | PRN
  Administered 2013-04-03 (×2): 1 mg via INTRAVENOUS

## 2013-04-03 MED ORDER — ZOLPIDEM TARTRATE 5 MG PO TABS: 5.0000 mg | ORAL_TABLET | Freq: Every evening | ORAL | Status: DC | PRN

## 2013-04-03 MED ORDER — ACETAMINOPHEN 10 MG/ML IV SOLN
INTRAVENOUS | Status: DC | PRN
  Administered 2013-04-03: 1000 mg via INTRAVENOUS

## 2013-04-03 MED ORDER — SUCCINYLCHOLINE CHLORIDE 20 MG/ML IJ SOLN
INTRAMUSCULAR | Status: DC | PRN
  Administered 2013-04-03: 100 mg via INTRAVENOUS

## 2013-04-03 MED ORDER — KETOROLAC TROMETHAMINE 30 MG/ML IJ SOLN
30.0000 mg | Freq: Four times a day (QID) | INTRAMUSCULAR | Status: DC
  Filled 2013-04-03 (×4): qty 1

## 2013-04-03 MED ORDER — ACETAMINOPHEN 10 MG/ML IV SOLN
INTRAVENOUS | Status: AC
  Filled 2013-04-03: qty 100

## 2013-04-03 MED ORDER — HYDROMORPHONE HCL PF 1 MG/ML IJ SOLN: 0.2500 mg | INTRAMUSCULAR | Status: DC | PRN

## 2013-04-03 MED ORDER — DILTIAZEM HCL ER 180 MG PO CP24
180.0000 mg | ORAL_CAPSULE | Freq: Every day | ORAL | Status: DC
  Filled 2013-04-03: qty 1

## 2013-04-03 MED ORDER — KCL IN DEXTROSE-NACL 20-5-0.45 MEQ/L-%-% IV SOLN
INTRAVENOUS | Status: DC
  Administered 2013-04-03 – 2013-04-04 (×3): via INTRAVENOUS
  Filled 2013-04-03 (×5): qty 1000

## 2013-04-03 MED ORDER — GLYCOPYRROLATE 0.2 MG/ML IJ SOLN
INTRAMUSCULAR | Status: DC | PRN
  Administered 2013-04-03: .6 mg via INTRAVENOUS

## 2013-04-03 MED ORDER — ONDANSETRON HCL 4 MG/2ML IJ SOLN
INTRAMUSCULAR | Status: DC | PRN
  Administered 2013-04-03: 4 mg via INTRAVENOUS

## 2013-04-03 MED ORDER — ROCURONIUM BROMIDE 100 MG/10ML IV SOLN
INTRAVENOUS | Status: DC | PRN
  Administered 2013-04-03: 55 mg via INTRAVENOUS
  Administered 2013-04-03: 20 mg via INTRAVENOUS
  Administered 2013-04-03: 5 mg via INTRAVENOUS

## 2013-04-03 MED ORDER — LIDOCAINE HCL (CARDIAC) 20 MG/ML IV SOLN
INTRAVENOUS | Status: DC | PRN
  Administered 2013-04-03: 100 mg via INTRAVENOUS

## 2013-04-03 MED ORDER — ONDANSETRON 4 MG PO TBDP
4.0000 mg | ORAL_TABLET | Freq: Three times a day (TID) | ORAL | Status: DC | PRN
  Filled 2013-04-03: qty 1

## 2013-04-03 SURGICAL SUPPLY — 56 items
BENZOIN TINCTURE PRP APPL 2/3 (GAUZE/BANDAGES/DRESSINGS) ×3 IMPLANT
CHLORAPREP W/TINT 26ML (MISCELLANEOUS) ×3 IMPLANT
CLOTH BEACON ORANGE TIMEOUT ST (SAFETY) ×3 IMPLANT
CORD HIGH FREQUENCY UNIPOLAR (ELECTROSURGICAL) ×3 IMPLANT
CORDS BIPOLAR (ELECTRODE) ×3 IMPLANT
COVER MAYO STAND STRL (DRAPES) ×3 IMPLANT
COVER SURGICAL LIGHT HANDLE (MISCELLANEOUS) ×3 IMPLANT
COVER TIP SHEARS 8 DVNC (MISCELLANEOUS) ×2 IMPLANT
COVER TIP SHEARS 8MM DA VINCI (MISCELLANEOUS) ×1
DECANTER SPIKE VIAL GLASS SM (MISCELLANEOUS) IMPLANT
DRAPE LG THREE QUARTER DISP (DRAPES) ×6 IMPLANT
DRAPE SURG IRRIG POUCH 19X23 (DRAPES) ×3 IMPLANT
DRAPE TABLE BACK 44X90 PK DISP (DRAPES) ×6 IMPLANT
DRAPE UTILITY XL STRL (DRAPES) ×3 IMPLANT
DRAPE WARM FLUID 44X44 (DRAPE) ×3 IMPLANT
DRSG TEGADERM 2-3/8X2-3/4 SM (GAUZE/BANDAGES/DRESSINGS) ×6 IMPLANT
DRSG TEGADERM 6X8 (GAUZE/BANDAGES/DRESSINGS) ×6 IMPLANT
ELECT REM PT RETURN 9FT ADLT (ELECTROSURGICAL) ×3
ELECTRODE REM PT RTRN 9FT ADLT (ELECTROSURGICAL) ×2 IMPLANT
FILTER SMOKE EVAC LAPAROSHD (FILTER) IMPLANT
GAUZE SPONGE 2X2 8PLY STRL LF (GAUZE/BANDAGES/DRESSINGS) ×2 IMPLANT
GAUZE VASELINE 3X9 (GAUZE/BANDAGES/DRESSINGS) IMPLANT
GLOVE BIO SURGEON STRL SZ 6.5 (GLOVE) ×6 IMPLANT
GLOVE BIO SURGEON STRL SZ7.5 (GLOVE) ×9 IMPLANT
GLOVE INDICATOR 8.0 STRL GRN (GLOVE) ×6 IMPLANT
GOWN PREVENTION PLUS XLARGE (GOWN DISPOSABLE) ×6 IMPLANT
GOWN STRL REIN XL XLG (GOWN DISPOSABLE) ×6 IMPLANT
HOLDER FOLEY CATH W/STRAP (MISCELLANEOUS) ×3 IMPLANT
KIT ACCESSORY DA VINCI DISP (KITS) ×1
KIT ACCESSORY DVNC DISP (KITS) ×2 IMPLANT
MANIPULATOR UTERINE 4.5 ZUMI (MISCELLANEOUS) ×3 IMPLANT
OCCLUDER COLPOPNEUMO (BALLOONS) ×3 IMPLANT
PACK LAPAROSCOPY W LONG (CUSTOM PROCEDURE TRAY) ×3 IMPLANT
POUCH SPECIMEN RETRIEVAL 10MM (ENDOMECHANICALS) ×6 IMPLANT
SET TUBE IRRIG SUCTION NO TIP (IRRIGATION / IRRIGATOR) ×3 IMPLANT
SHEET LAVH (DRAPES) ×3 IMPLANT
SOLUTION ELECTROLUBE (MISCELLANEOUS) ×3 IMPLANT
SPONGE GAUZE 2X2 STER 10/PKG (GAUZE/BANDAGES/DRESSINGS) ×1
SPONGE LAP 18X18 X RAY DECT (DISPOSABLE) ×3 IMPLANT
STRIP CLOSURE SKIN 1/2X4 (GAUZE/BANDAGES/DRESSINGS) ×3 IMPLANT
SUT VIC AB 0 CT1 27 (SUTURE) ×1
SUT VIC AB 0 CT1 27XBRD ANTBC (SUTURE) ×2 IMPLANT
SUT VIC AB 0 CT1 36 (SUTURE) ×3 IMPLANT
SUT VIC AB 4-0 PS2 27 (SUTURE) ×6 IMPLANT
SUT VICRYL 0 UR6 27IN ABS (SUTURE) ×3 IMPLANT
SYR 50ML LL SCALE MARK (SYRINGE) ×3 IMPLANT
SYR BULB IRRIGATION 50ML (SYRINGE) IMPLANT
TOWEL OR 17X26 10 PK STRL BLUE (TOWEL DISPOSABLE) ×6 IMPLANT
TRAP SPECIMEN MUCOUS 40CC (MISCELLANEOUS) IMPLANT
TRAY FOLEY CATH 14FRSI W/METER (CATHETERS) ×3 IMPLANT
TROCAR 12M 150ML BLUNT (TROCAR) ×3 IMPLANT
TROCAR BLADELESS OPT 5 100 (ENDOMECHANICALS) ×3 IMPLANT
TROCAR BLADELESS OPT 5 75 (ENDOMECHANICALS) IMPLANT
TROCAR ENDOPATH XCEL 12X100 BL (ENDOMECHANICALS) IMPLANT
TROCAR XCEL 12X100 BLDLESS (ENDOMECHANICALS) ×3 IMPLANT
WATER STERILE IRR 1500ML POUR (IV SOLUTION) IMPLANT

## 2013-04-03 NOTE — H&P (View-Only) (Signed)
Consult Note: Gyn-Onc  Consult was requested by Dr. Auma Thongteum for the evaluation of Jennifer Sexton 62 y.o. female  CC: Grade 1 endometrial cancer   Assessment/Plan: 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 An ultrasound obtained  The same day as the endometrial biopsy demonstrated a uterus 7.8 cm in length.   A detailed discussion was held with the patient  with regard to to her endometrial cancer diagnosis. We discussed the standard management options for uterine cancer which includes surgery followed possibly by adjuvant therapy depending on the results of surgery. The options for surgical management include a hysterectomy and removal of the tubes and ovaries possibly with removal of pelvic lymph nodes. A minimally invasive approach including a robotic hysterectomy or laparoscopic hysterectomy have benefits including shorter hospital stay, recovery time and better wound healing. The alternative approach is an open hysterectomy. The patient has been counseled about these surgical options and the risks of surgery in general including infection, bleeding, damage to surrounding structures (including bowel, bladder, ureters, nerves or vessels), and the postoperative risks of PE/ DVT, and lymphedema. I extensively reviewed the additional risks of robotic hysterectomy including possible need for conversion to open laparotomy.  I discussed positioning during surgery of trendelenberg and risks of minor facial swelling and care we take in preoperative positioning.  After counseling and consideration of her options, she desires to proceed with robotic endometrial cancer staging. The endometrial cancer specimen will be sent for microsatellite instability and IHC staining particularly given her family history.  The patient is aware that she will be contacted regarding a surgery date and that either I or Dr.  Paola Gehrig be her surgeon. At her preference of the procedure be performed after April 15.  She will be seen by anesthesia for preoperative clearance and discussion of postoperative pain management.  She was given the opportunity to ask questions, which were answered to her satisfaction, and she is agreement with the above mentioned plan of care.   HPI: [name]  is a 62 y.o.  year old who in February of 2014 noted vaginal bleeding. An ultrasound was performed on 02/20/2013 and this demonstrated a uterus that measures 7.3 cm in length. The endometrium was noted to be thickened and irregular in appearance and measuring 1.9 cm. She underwent an endometrial biopsy on 02/21/2012   Endometrium, biopsy - ENDOMETRIOID ADENOCARCINOMA. Microscopic Comment Although grade is best determined at time of surgical excision, the adenocarcinoma appears FIGO grade I.  [name] reports occasional vaginal discharge. She denies any weight loss but does report some abdominal tightness and bloating. Reports 2 episodes of diarrhea but denies any recent weight loss changes in her bowel or rectal habits or hematuria.  Her history is remarkable for a father who was diagnosed with colon cancer the age of 42 and died one year later.   Current Meds:  Outpatient Encounter Prescriptions as of 03/13/2013  Medication Sig Dispense Refill  . Calcium Carbonate-Vitamin D (CALCIUM + D PO) Take by mouth daily.      . diltiazem (DILACOR XR) 180 MG 24 hr capsule Take 180 mg by mouth daily.      . levothyroxine (SYNTHROID, LEVOTHROID) 100 MCG tablet Take 100 mcg by mouth daily. Takes 1/2 on Monday       No facility-administered encounter medications on file as of 03/13/2013.    Allergy: No Known Allergies    Social Hx:   History   Social History  . Marital Status: Married    Spouse Name: N/A    Number of Children: N/A  . Years of Education: N/A   Occupational History  . Not on file.   Social History Main Topics   . Smoking status: Current Some Day Smoker -- 0.00 packs/day for 47 years  . Smokeless tobacco: Not on file  . Alcohol Use: Yes     Comment: 2 a day  . Drug Use: No  . Sexually Active: Not on file     Comment: 0.5 pack a week   Other Topics Concern  . Not on file   Social History Narrative  . No narrative on file    Past Surgical Hx:  Past Surgical History  Procedure Laterality Date  . Thyroidectomy    . Oophorectomy  2005  . Tonsillectomy  1969  . Wisdom tooth extraction  1969  . Tubal ligation  1987  . Cataract extraction      2008-right eye, 2010- left eye  . Root canal  2013  RSO R ovarian fibroma .2005  Past Medical Hx:  Past Medical History  Diagnosis Date  . Chest pain   . Paroxysmal supraventricular tachycardia   . Tobacco dependence   . Ulcerative proctitis   . Hypothyroidism   . Allergic rhinitis   . Elevated LDL cholesterol level   . Hypertension   . Broken foot 2002    left    Past Gynecological History: G2 P2 menarche at age of 12 with regular menses menopause occurred in 2002 reports at least 10 years use of oral contraceptive pills  Family Hx:  Family History  Problem Relation Age of Onset  . Cerebral aneurysm Mother   . Colon cancer Father     Review of Systems:  Constitutional  Feels well,   Skin/Breast  No rash, sores, jaundice, itching, dryness Cardiovascular  No chest pain, shortness of breath, or edema  Pulmonary  No cough or wheeze.  Gastro Intestinal  No nausea, vomitting, or diarrhoea. No bright red blood per rectum, no abdominal pain, change in bowel movement, Genito Urinary  No frequency, urgency, dysuria,  Musculo Skeletal  No myalgia, arthralgia, joint swelling or pain  Neurologic  No weakness, numbness, change in gait,  Psychology  No depression, reprots significant "concern"   Vitals: BP 112/70  Pulse 64  Temp(Src) 98.6 F (37 C)  Resp 16  Ht 5' 4.29" (1.633 m)  Wt 163 lb 9.6 oz (74.208 kg)  BMI 27.83  kg/m2  Physical Exam: WD in NAD Neck  Supple NROM, without any enlargements.  Lymph Node Survey No cervical supraclavicular or inguinal adenopathy Cardiovascular  Pulse normal rate, regularity and rhythm. S1 and S2 normal.  Lungs  Clear to auscultation bilateraly, without wheezes/crackles/rhonchi. Good air movement.  Psychiatry  Alert and oriented to person, place, and time  Abdomen  Normoactive bowel sounds, abdomen soft, non-tender and obese.  Back No CVA tenderness Genito Urinary  Vulva/vagina: Normal external female genitalia.  No lesions. No discharge or bleeding.  Bladder/urethra:  No lesions or masses  Vagina: atrophic, no bleeding or discharge  Cervix: Normal appearing, no lesions.  Uterus: Small, mobile, no parametrial involvement or nodularity.  Adnexa: No palpable masses. Rectal  Good tone, no masses no cul de sac nodularity.  Extremities  No bilateral cyanosis, clubbing or edema.   Samanta Gal, MD, PhD 03/13/2013, 12:34 PM   

## 2013-04-03 NOTE — Interval H&P Note (Signed)
History and Physical Interval Note:  04/03/2013 7:02 AM  Jennifer Sexton  has presented today for surgery, with the diagnosis of Endomentrial cancer  The various methods of treatment have been discussed with the patient and family. After consideration of risks, benefits and other options for treatment, the patient has consented to  Procedure(s): ROBOTIC ASSISTED TOTAL HYSTERECTOMY WITH BILATERAL SALPINGO OOPHORECTOMY (Bilateral) LYMPH NODE DISSECTION (N/A) as a surgical intervention .  The patient's history has been reviewed, patient examined, no change in status, stable for surgery.  I have reviewed the patient's chart and labs.  Questions were answered to the patient's satisfaction.     [place], [facility]

## 2013-04-03 NOTE — Transfer of Care (Signed)
Immediate Anesthesia Transfer of Care Note  Patient: Jennifer Sexton  Procedure(s) Performed: Procedure(s): ROBOTIC ASSISTED TOTAL HYSTERECTOMY WITH LEFT SALPINGO OOPHORECTOMY (Left) LYMPH NODE DISSECTION (N/A)  Patient Location: PACU  Anesthesia Type:General  Level of Consciousness: awake, alert , oriented and patient cooperative  Airway & Oxygen Therapy: Patient Spontanous Breathing and Patient connected to face mask oxygen  Post-op Assessment: Report given to PACU RN, Post -op Vital signs reviewed and stable and Patient moving all extremities X 4  Post vital signs: stable  Complications: No apparent anesthesia complications

## 2013-04-03 NOTE — Op Note (Signed)
Preoperative Diagnosis: Grade 1 endometrioid endometrial cancer  Postoperative Diagnosis: Stage IA grade 1 endometrioid endometrial cancer  Procedure(s) Performed: Robotic total laparoscopic hysterectomy, left salpingo oophorectomy,  bilateral pelvic lymph node dissection  Anesthesia: Gen. endotracheal  Surgeon: [name].  Nelly Rout, M.D. PhD  Assistant Surgeon: Antionette Char MD.  Assistant: Telford Nab RN, MSN  Specimens: Uterus cervix, left ovary and tube bilateral pelvic lymph nodes  Estimated Blood Loss: .   Complications: None  Indication for Procedure: This is a 62 y.o.-old who underwent prior laparoscopicright salpingo-oophorectomy.  A recent endometrial biopsy demonstrated grade 1 endometrial cancer.  Operative Findings:  A cm uterus. Absent right adnexa normal-appearing left adnexa.   Frozen pathology was consistent with grade 1 endometrial cancer with minimal microinvasion  Procedure: Patient was taken to the operating room and timeout procedures performed in the standard fashion. She was then  placed under general endotracheal anesthesia without any difficulty. She is placed in the dorsal lithotomy position and secured to the operative table over the chest with tape.   The patient was prepped and draped and the uterine manipulator placed within the endometrial cavity. The appropriately sized Koh ring was circumferentially around the cervix. The balloon was placed within the vagina. An OG tube was present and functional.   At an area on the left in line with the nipple approximately 2 cm below the ribs the area was infiltrated with 1% lidocaine and a 5 mm Optiview inserted under direct visualization. The abdomen was insufflated to 15 mm of mercury and the pressure never deviated above that throughout the remainder of the procedure. Maximum Trendelenburg positioning was obtained. Upon inspection the omentum was noted the adherent to the anterior abdominal wall.   Incisions were made At approximately 25 cm proximal to the symphysis pubis an incision was made just superior to the umbilicus. This area was infiltrated with lidocaine as well as the location 10 cm lateral to this incision and 2 cm superior to the left anterior superior iliac spine. Incisions were made. 10 mm trocar was inserted in the superior umbilicus incision. Millimeter robotic ports were placed in the other 3 incisions. The left upper quadrant port site was replaced with a 10 mm port. This was all completed under direct visualization. The omentum was dissected from its attachments to the anterior abdominal wall appear .The small and large bowel were reflected as much as possible into the upper abdomen. The robot was docked and instruments placed.  The right round ligament was transected and the ureter was identified. The retroperitoneal space was entered on the right and the peritoneum incised to the level of the vesicouterine ligament anteriorly. The bladder flap was created using Bovie cautery. The peritoneal dissection was continued inferiorly and across the inferior most aspect of the cervix. In this manner the urethra was deflected inferiorly. The bladder flap was further developed. The uterine vessels on the right were skeletonized ligated and transected.  The left ureter was identified. The left gonadal vessels were cauterized and transected. The broad ligament was skeletonized posteriorly to the level of the cervix and the peritoneum dissected free from the cervix and in this fashion the ureter was deflected inferiorly. The anterior peritoneum was further dissected and the bladder flap appropriately developed. The uterine vessels were skeletonized cauterized and transected. The balloon and the vagina was then maximally insufflated. A colpotomy incision was made circumferentially on the Koh ring.  The uterus cervix left ovaries and tube were delivered from the vagina. [facility] ring  was removed and  the balloon was replaced.  Right pelvic lymph node dissection was then initiated. The superior vesicle artery was identified and the vesicouterine space developed. The obturator nerve was identified. Nodal tissue was removed within the boundaries of the right genitofemoral nerve the right circumflex vein, the ureter and the superior vesicle artery.  The nodal tissue was placed in an Endo Catch bag. The left pelvic lymph node dissection was then initiated. The superior vesicle artery was identified and the vesicouterine space developed. The obturator nerve was identified. Nodal tissue was removed within the boundaries of the left  genitofemoral nerve the left circumflex vein, the ureter and the superior vesicle artery. The nodal tissue was placed in an Endo Catch bag.   At that time the frozen section evaluation returned and it was significant tumor with less than 50% myometrial invasion.   The specimens were removed through the vagina. The vaginal balloon was reinserted.  The pelvis was copiously irrigated and drained and hemostasis was assured. The vaginal cuff was closed with a running 0 Vicryl suture ligature. Interrupted sutures were also placed. The needle was removed under direct visualization. The operative site is once again visualized and hemostasis was assured. Positive pelvic pressure was applied to facilitate removal of the CO2 insufflate.  The instruments were removed from the abdomen and pelvis and the port sites irrigated. The umbilical fascia was closed with an interrupted 0 Vicryl  suture. The subcutaneous tissue of the umbilical left upper quadrant and right lower quadrant subcutaneous tissues were approximated with a single suture. Skin incisions were closed with a subcuticular suture.  The vaginal vault was cleared with a moist sponge stick.  Sponge, lap and needle counts were correct x 3.    The patient had sequential compression devices and preoperative Lovenox for VTE  prophylaxis.         Disposition: PACU - hemodynamically stable.         Condition:stable Foley draining clear urine.

## 2013-04-03 NOTE — Anesthesia Postprocedure Evaluation (Signed)
  Anesthesia Post-op Note  Patient: Jennifer Sexton  Procedure(s) Performed: Procedure(s) (LRB): ROBOTIC ASSISTED TOTAL HYSTERECTOMY WITH LEFT SALPINGO OOPHORECTOMY (Left) LYMPH NODE DISSECTION (N/A)  Patient Location: PACU  Anesthesia Type: General  Level of Consciousness: awake and alert   Airway and Oxygen Therapy: Patient Spontanous Breathing  Post-op Pain: mild  Post-op Assessment: Post-op Vital signs reviewed, Patient's Cardiovascular Status Stable, Respiratory Function Stable, Patent Airway and No signs of Nausea or vomiting  Last Vitals:  Filed Vitals:   04/03/13 1045  BP: 107/61  Pulse: 56  Temp:   Resp: 9    Post-op Vital Signs: stable   Complications: No apparent anesthesia complications

## 2013-04-04 ENCOUNTER — Encounter (HOSPITAL_COMMUNITY): Payer: Self-pay | Admitting: Gynecologic Oncology

## 2013-04-04 LAB — BASIC METABOLIC PANEL
BUN: 6 mg/dL (ref 6–23)
Chloride: 103 mEq/L (ref 96–112)
GFR calc Af Amer: >90 mL/min (ref >90)
GFR calc non Af Amer: >90 mL/min (ref >90)
Potassium: 4.2 mEq/L (ref 3.5–5.1)
Sodium: 134 mEq/L — ABNORMAL LOW (ref 135–145)

## 2013-04-04 LAB — CBC
HCT: 34.4 % — ABNORMAL LOW (ref 36.0–46.0)
Hemoglobin: 11.4 g/dL — ABNORMAL LOW (ref 12.0–15.0)
MCHC: 33.1 g/dL (ref 30.0–36.0)
WBC: 10.3 10*3/uL (ref 4.0–10.5)

## 2013-04-04 MED ORDER — OXYCODONE-ACETAMINOPHEN 5-325 MG PO TABS: 1.0000 | ORAL_TABLET | ORAL | Status: DC | PRN

## 2013-04-04 NOTE — Discharge Summary (Signed)
Physician Discharge Summary  Patient ID: Jennifer Sexton MRN: [id number] DOB/AGE: 05/30/1951 61 y.o.  Admit date: 04/03/2013 Discharge date: 04/04/2013  Admission Diagnoses: Malignant neoplasm of corpus uteri, except isthmus  Discharge Diagnoses:  Principal Problem:   Malignant neoplasm of corpus uteri, except isthmus  Discharged Condition: The patient is in good condition and stable for discharge.  Hospital Course: On 04/03/2013, the patient underwent the following: Procedure(s):  ROBOTIC ASSISTED TOTAL HYSTERECTOMY WITH LEFT SALPINGO OOPHORECTOMY LYMPH NODE DISSECTION.  The postoperative course was uneventful.  She was discharged to home on postoperative day 1 tolerating a regular diet.  Consults: None  Significant Diagnostic Studies: None  Treatments: surgery: see above  Discharge Exam: Blood pressure 99/62, pulse 87, temperature 98 F (36.7 C), temperature source Oral, resp. rate 18, height 5\' 5"  (1.651 m), weight 161 lb 6.4 oz (73.211 kg), SpO2 97.00%. General appearance: alert, cooperative and no distress Resp: clear to auscultation bilaterally Cardio: regular rate and rhythm, S1, S2 normal, no murmur, click, rub or gallop GI: abdomen soft, non-tender, non-tympanic, hypoactive bowel sounds Extremities: extremities normal, atraumatic, no cyanosis or edema Incision/Wound: Lap sites to abdomen with steri strips clean, dry, and intact.  Scattered ecchymosis around the incisions.  Disposition: Home  Discharge Orders   Future Orders Complete By Expires     Call MD for:  difficulty breathing, headache or visual disturbances  As directed     Call MD for:  extreme fatigue  As directed     Call MD for:  hives  As directed     Call MD for:  persistant dizziness or light-headedness  As directed     Call MD for:  persistant nausea and vomiting  As directed     Call MD for:  redness, tenderness, or signs of infection (pain, swelling, redness, odor or green/yellow discharge around  incision site)  As directed     Call MD for:  severe uncontrolled pain  As directed     Call MD for:  temperature >100.4  As directed     Diet - low sodium heart healthy  As directed     Driving Restrictions  As directed     Comments:      No driving for 1 week.  Do not take narcotics and drive.    Increase activity slowly  As directed     Lifting restrictions  As directed     Comments:      No lifting greater than 10 lbs.    Sexual Activity Restrictions  As directed     Comments:      No sexual activity, nothing in the vagina, for 8 weeks.        Medication List    TAKE these medications       CALCIUM + D PO  Take by mouth daily.     diltiazem 180 MG 24 hr capsule  Commonly known as:  DILACOR XR  Take 180 mg by mouth daily at 12 noon.     levothyroxine 100 MCG tablet  Commonly known as:  SYNTHROID, LEVOTHROID  Take 50-100 mcg by mouth every morning. Takes 1/2 on Monday     oxyCODONE-acetaminophen 5-325 MG per tablet  Commonly known as:  PERCOCET/ROXICET  Take 1-2 tablets by mouth every 4 (four) hours as needed.           Follow-up Information   Please follow up. (GYN Oncology will call you with your final pathology results and to schedule your follow up  appointment.)       Signed: Leor Whyte DEAL 04/04/2013, 9:23 AM

## 2013-04-12 ENCOUNTER — Telehealth: Payer: Self-pay | Admitting: Gynecologic Oncology

## 2013-04-12 NOTE — Telephone Encounter (Signed)
Post op telephone call to check patient status.  Patient describes expected post operative status.  Adequate PO intake reported.  Bowels and bladder functioning without difficulty.  Pain minimal.  Reportable signs and symptoms reviewed.  Follow up appt given for June 17.  Patient informed of final pathology results and informed to call for any questions or concerns.

## 2013-04-17 ENCOUNTER — Telehealth: Payer: Self-pay | Admitting: Gynecologic Oncology

## 2013-04-17 NOTE — Telephone Encounter (Signed)
Returning call to patient about vaginal bleeding.  Patient stating that she had a minimal amount of vaginal bleeding when wiping after having a bowel movement.  No other concerns voiced.  Instructed to monitor the light vaginal spotting and call if it does not resolve in 3 to 5 days or if it increases in frequency.  Reassured that this can be a normal finding two to three weeks after robotic surgery.  Instructed to call the office for any questions or concerns.  Reportable signs and symptoms reviewed.

## 2013-05-28 ENCOUNTER — Telehealth: Payer: Self-pay | Admitting: Gynecologic Oncology

## 2013-05-28 NOTE — Telephone Encounter (Signed)
Post op check Note: Gyn-Onc  CC: Stage IA grade 1 endometrial cancer   Assessment/Plan: [name] is a 62 year old with stage IA grade 1 endometrial cancer. F/U with Dr. Nicanor Bake in 6 months F/U with Gyn Onc in 12 months Patient counselled regarding signs and symptoms of recurrent disease  HPI: [name]  is a 62 y.o.  year old who in February of 2014 noted vaginal bleeding. An ultrasound was performed on 02/20/2013 and this demonstrated a uterus that measures 7.3 cm in length. The endometrium was noted to be thickened and irregular in appearance and measuring 1.9 cm. She underwent an endometrial biopsy on 02/21/2012   Endometrium, biopsy - ENDOMETRIOID ADENOCARCINOMA. FIGO grade I.  Her history is remarkable for a father who was diagnosed with colon cancer the age of 6 and died one year later.  On 14-Apr-2013 she underwent RTH LSO BPLND  Path  1. UTERUS Specimen: Uterus, cervix, left fallopian tube and ovary. Procedure: Hysterectomy and left salpingo oophorectomy. Lymph node sampling performed: Yes. Specimen integrity: Intact. Maximum tumor size (cm): 5 cm, gross estimate. Histologic type: Invasive endometrioid carcinoma with squamous differentiation. Grade: FIGO grade I. Myometrial invasion: 0.5 cm where myometrium is 1.5 cm in thickness Cervical stromal involvement: No. Extent of involvement of other organs: No. Lymph vascular invasion: Not identified. Lymph nodes: number examined - 12 ; number positive - 0  Past Surgical Hx:  Past Surgical History  Procedure Laterality Date  . Thyroidectomy    . Oophorectomy  2005  . Tonsillectomy  1969  . Wisdom tooth extraction  1969  . Tubal ligation  1987  . Cataract extraction      2008-right eye, 2010- left eye  . Root canal  2013  . Robotic assisted total hysterectomy with bilateral salpingo oopherectomy Left 14-Apr-2013    Procedure: ROBOTIC ASSISTED TOTAL HYSTERECTOMY WITH LEFT SALPINGO OOPHORECTOMY;   Surgeon: Laurette Schimke, MD;  Location: [facility];  Service: Gynecology;  Laterality: Left;  . Lymph node dissection N/A 04-14-13    Procedure: LYMPH NODE DISSECTION;  Surgeon: Laurette Schimke, MD;  Location: [facility];  Service: Gynecology;  Laterality: N/A;  RSO R ovarian fibroma .2005  Past Medical Hx:  Past Medical History  Diagnosis Date  . Chest pain   . Paroxysmal supraventricular tachycardia   . Tobacco dependence   . Ulcerative proctitis   . Hypothyroidism   . Allergic rhinitis   . Elevated LDL cholesterol level   . Hypertension   . Broken foot 2002    left  . Arthritis   . Anemia     hx of     Past Gynecological History: G2 P2 menarche at age of 55 with regular menses menopause occurred in 2002 reports at least 10 years use of oral contraceptive pills  Family Hx:  Family History  Problem Relation Age of Onset  . Cerebral aneurysm Mother   . Colon cancer Father     Review of Systems:  Constitutional  Feels well,   Skin/Breast  No rash, sores, jaundice, itching, dryness Cardiovascular  No chest pain, shortness of breath, or edema  Pulmonary  No cough or wheeze.  Gastro Intestinal  No nausea, vomitting, or diarrhoea. No bright red blood per rectum, no abdominal pain, change in bowel movement, Genito Urinary  No frequency, urgency, dysuria,  Musculo Skeletal  No myalgia, arthralgia, joint swelling or pain  Neurologic  No weakness, numbness, change in gait,  Psychology  No depression, reprots significant "concern"  Vitals: BP 112/70  Pulse 64  Temp(Src) 98.6 F (37 C)  Resp 16  Ht 5' 4.29" (1.633 m)  Wt 163 lb 9.6 oz (74.208 kg)  BMI 27.83 kg/m2  Physical Exam: WD in NAD Neck  Supple NROM, without any enlargements.  Lymph Node Survey No cervical supraclavicular or inguinal adenopathy Cardiovascular  Pulse normal rate, regularity and rhythm. S1 and S2 normal.  Lungs  Clear to auscultation bilateraly, without wheezes/crackles/rhonchi. Good air  movement.  Psychiatry  Alert and oriented to person, place, and time  Abdomen  Normoactive bowel sounds, abdomen soft, non-tender and obese.  Back No CVA tenderness Genito Urinary  Vulva/vagina: Normal external female genitalia.  No lesions. No discharge or bleeding.  Bladder/urethra:  No lesions or masses  Vagina: atrophic, no bleeding or discharge   Good tone, no masses no cul de sac nodularity.  Extremities  No bilateral cyanosis, clubbing or edema.   Laurette Schimke, MD, PhD 05/28/2013, 10:05 PM

## 2013-05-29 ENCOUNTER — Ambulatory Visit: Payer: BC Managed Care – PPO | Attending: Gynecologic Oncology | Admitting: Gynecologic Oncology

## 2013-05-29 ENCOUNTER — Encounter: Payer: Self-pay | Admitting: Gynecologic Oncology

## 2013-05-29 VITALS — BP 122/68 | HR 80 | Temp 98.6°F | Resp 16 | Ht 64.29 in | Wt 161.4 lb

## 2013-05-29 DIAGNOSIS — C541 Malignant neoplasm of endometrium: Secondary | ICD-10-CM

## 2013-05-29 DIAGNOSIS — Z9079 Acquired absence of other genital organ(s): Secondary | ICD-10-CM | POA: Insufficient documentation

## 2013-05-29 DIAGNOSIS — C549 Malignant neoplasm of corpus uteri, unspecified: Secondary | ICD-10-CM | POA: Insufficient documentation

## 2013-05-29 DIAGNOSIS — E039 Hypothyroidism, unspecified: Secondary | ICD-10-CM | POA: Insufficient documentation

## 2013-05-29 DIAGNOSIS — Z800 Family history of malignant neoplasm of digestive organs: Secondary | ICD-10-CM | POA: Insufficient documentation

## 2013-05-29 DIAGNOSIS — I10 Essential (primary) hypertension: Secondary | ICD-10-CM | POA: Insufficient documentation

## 2013-05-29 DIAGNOSIS — Z90710 Acquired absence of both cervix and uterus: Secondary | ICD-10-CM | POA: Insufficient documentation

## 2013-05-29 NOTE — Progress Notes (Signed)
Post op check Note: Gyn-Onc  CC: Stage IA grade 1 endometrial cancer   Assessment/Plan: Jennifer Sexton is a 62 year old with stage IA grade 1 endometrial cancer. F/U with Dr. Nicanor Bake in 12 months F/U with Gyn Onc in 6 months Patient counselled regarding signs and symptoms of recurrent disease  Will request endometrial specimen be sent for [facility] given the family history of a father with colon cancer the age of 65  HPI: Jennifer Sexton  is a 62 y.o.  year old who in February of 2014 noted vaginal bleeding. An ultrasound was performed on 02/20/2013 and this demonstrated a uterus that measures 7.3 cm in length. The endometrium was noted to be thickened and irregular in appearance and measuring 1.9 cm. She underwent an endometrial biopsy on 02/21/2012   Endometrium, biopsy - ENDOMETRIOID ADENOCARCINOMA. FIGO grade I.  Her history is remarkable for a father who was diagnosed with colon cancer the age of 33 and died one year later.  On 04-22-2013 she underwent RTH LSO BPLND  Path  1. UTERUS Specimen: Uterus, cervix, left fallopian tube and ovary. Procedure: Hysterectomy and left salpingo oophorectomy. Lymph node sampling performed: Yes. Specimen integrity: Intact. Maximum tumor size (cm): 5 cm, gross estimate. Histologic type: Invasive endometrioid carcinoma with squamous differentiation. Grade: FIGO grade I. Myometrial invasion: 0.5 cm where myometrium is 1.5 cm in thickness Cervical stromal involvement: No. Extent of involvement of other organs: No. Lymph vascular invasion: Not identified. Lymph nodes: number examined - 12 ; number positive - 0  Past Surgical Hx:  Past Surgical History  Procedure Laterality Date  . Thyroidectomy    . Oophorectomy  2005  . Tonsillectomy  1969  . Wisdom tooth extraction  1969  . Tubal ligation  1987  . Cataract extraction      2008-right eye, 2010- left eye  . Root canal  2013  . Robotic assisted total hysterectomy with bilateral  salpingo oopherectomy Left 04/22/13    Procedure: ROBOTIC ASSISTED TOTAL HYSTERECTOMY WITH LEFT SALPINGO OOPHORECTOMY;  Surgeon: Laurette Schimke, MD;  Location: [facility];  Service: Gynecology;  Laterality: Left;  . Lymph node dissection N/A 04/22/13    Procedure: LYMPH NODE DISSECTION;  Surgeon: Laurette Schimke, MD;  Location: [facility];  Service: Gynecology;  Laterality: N/A;  RSO R ovarian fibroma .2005  Past Medical Hx:  Past Medical History  Diagnosis Date  . Chest pain   . Paroxysmal supraventricular tachycardia   . Tobacco dependence   . Ulcerative proctitis   . Hypothyroidism   . Allergic rhinitis   . Elevated LDL cholesterol level   . Hypertension   . Broken foot 2002    left  . Arthritis   . Anemia     hx of     Past Gynecological History: G2 P2 menarche at age of 72 with regular menses menopause occurred in 2002 reports at least 10 years use of oral contraceptive pills  Family Hx:  Family History  Problem Relation Age of Onset  . Cerebral aneurysm Mother   . Colon cancer Father     Review of Systems:  Constitutional  Feels well,   Cardiovascular  No chest pain, shortness of breath, or edema  Pulmonary  No cough or wheeze.  Gastro Intestinal  No nausea, vomitting, or diarrhoea. No bright red blood per rectum, no abdominal pain, change in bowel movement, Genito Urinary  No frequency, urgency, dysuria, no vaginal bleeding or discharge Musculo Skeletal  No myalgia, arthralgia, joint swelling or  pain  Neurologic  No weakness, numbness, change in gait,     Vitals: BP 122/68  Pulse 80  Temp(Src) 98.6 F (37 C) (Oral)  Resp 16  Ht 5' 4.29" (1.633 m)  Wt 161 lb 6.4 oz (73.211 kg)  BMI 27.45 kg/m2  Physical Exam: WD in NAD Neck  Supple NROM, without any enlargements.  Lymph Node Survey No cervical supraclavicular or inguinal adenopathy Cardiovascular  Pulse normal rate, regularity and rhythm. S1 and S2 normal.  Lungs  Clear to auscultation bilateraly,  without wheezes/crackles/rhonchi. Good air movement.  Psychiatry  Alert and oriented to person, place, and time  Abdomen  Normoactive bowel sounds, abdomen soft, non-tender and obese.  Back No CVA tenderness Genito Urinary  Vulva/vagina: Normal external female genitalia.  No lesions. No discharge or bleeding.  Bladder/urethra:  No lesions or masses  Vagina: atrophic, no bleeding or discharge  cuff intact no lesions Good tone, no masses no cul de sac nodularity.  Extremities  No bilateral cyanosis, clubbing or edema.   Laurette Schimke, MD, PhD 05/29/2013, 4:25 PM

## 2013-08-28 ENCOUNTER — Other Ambulatory Visit (HOSPITAL_COMMUNITY): Payer: Self-pay | Admitting: Family Medicine

## 2013-08-28 DIAGNOSIS — Z780 Asymptomatic menopausal state: Secondary | ICD-10-CM

## 2013-08-28 DIAGNOSIS — Z1231 Encounter for screening mammogram for malignant neoplasm of breast: Secondary | ICD-10-CM

## 2013-09-11 ENCOUNTER — Ambulatory Visit (HOSPITAL_COMMUNITY)
Admission: RE | Admit: 2013-09-11 | Discharge: 2013-09-11 | Disposition: A | Discharge status: Home / Self Care | Payer: BC Managed Care – PPO | Source: Ambulatory Visit | Attending: Family Medicine | Admitting: Family Medicine

## 2013-09-11 ENCOUNTER — Other Ambulatory Visit (HOSPITAL_COMMUNITY): Payer: Self-pay | Admitting: Family Medicine

## 2013-09-11 DIAGNOSIS — Z1231 Encounter for screening mammogram for malignant neoplasm of breast: Secondary | ICD-10-CM | POA: Insufficient documentation

## 2013-09-11 DIAGNOSIS — Z780 Asymptomatic menopausal state: Secondary | ICD-10-CM

## 2013-09-11 DIAGNOSIS — Z13820 Encounter for screening for osteoporosis: Secondary | ICD-10-CM | POA: Insufficient documentation

## 2013-11-22 ENCOUNTER — Ambulatory Visit: Payer: BC Managed Care – PPO | Attending: Gynecologic Oncology | Admitting: Gynecologic Oncology

## 2013-11-29 ENCOUNTER — Ambulatory Visit: Payer: BC Managed Care – PPO | Admitting: Gynecologic Oncology

## 2014-01-03 ENCOUNTER — Ambulatory Visit: Payer: BC Managed Care – PPO | Attending: Gynecologic Oncology | Admitting: Gynecologic Oncology

## 2014-01-03 ENCOUNTER — Encounter: Payer: Self-pay | Admitting: Gynecologic Oncology

## 2014-01-03 VITALS — BP 134/79 | HR 102 | Temp 98.4°F | Resp 18 | Ht 64.29 in | Wt 168.8 lb

## 2014-01-03 DIAGNOSIS — Z90710 Acquired absence of both cervix and uterus: Secondary | ICD-10-CM | POA: Insufficient documentation

## 2014-01-03 DIAGNOSIS — E039 Hypothyroidism, unspecified: Secondary | ICD-10-CM | POA: Insufficient documentation

## 2014-01-03 DIAGNOSIS — Z800 Family history of malignant neoplasm of digestive organs: Secondary | ICD-10-CM | POA: Insufficient documentation

## 2014-01-03 DIAGNOSIS — C541 Malignant neoplasm of endometrium: Secondary | ICD-10-CM

## 2014-01-03 DIAGNOSIS — C549 Malignant neoplasm of corpus uteri, unspecified: Secondary | ICD-10-CM | POA: Insufficient documentation

## 2014-01-03 DIAGNOSIS — N952 Postmenopausal atrophic vaginitis: Secondary | ICD-10-CM | POA: Insufficient documentation

## 2014-01-03 DIAGNOSIS — I10 Essential (primary) hypertension: Secondary | ICD-10-CM | POA: Insufficient documentation

## 2014-01-03 NOTE — Patient Instructions (Signed)
stage IA grade 1 endometrial cancer staged 03/2013 F/U with Dr. Harrington Challenger in 6 months F/U with Gyn Onc in 12 months   Thank you very much Jennifer Sexton MAEVIS MUMBY for allowing me to provide care for you today.  I appreciate your confidence in choosing our Gynecologic Oncology team.  If you have any questions about your visit today please call our office and we will get back to you as soon as possible.  Francetta Found. Hassel Uphoff MD., PhD Gynecologic Oncology

## 2014-01-03 NOTE — Progress Notes (Signed)
Office Visit Note: Gyn-Onc  CC: Stage IA grade 1 endometrial cancer   Assessment/Plan: [name] is a 63 year old with stage IA grade 1 endometrial cancer staged 03/2013 F/U with Dr. Harrington Challenger  in 6 months F/U with Gyn Onc in 12 months Patient counselled regarding signs and symptoms of recurrent disease   HPI: [name]  is a 63 y.o.  year old who in February of 2014 noted vaginal bleeding. An ultrasound was performed on 02/20/2013 and this demonstrated a uterus that measures 7.3 cm in length. The endometrium was noted to be thickened and irregular in appearance and measuring 1.9 cm. She underwent an endometrial biopsy on 02/21/2012   Endometrium, biopsy - ENDOMETRIOID ADENOCARCINOMA. FIGO grade I.  Her history is remarkable for a father who was diagnosed with colon cancer the age of 43 and died one year later.  On 04/10/2013 she underwent RTH LSO BPLND  Path  1. UTERUS Specimen: Uterus, cervix, left fallopian tube and ovary. Procedure: Hysterectomy and left salpingo oophorectomy. Lymph node sampling performed: Yes. Specimen integrity: Intact. Maximum tumor size (cm): 5 cm, gross estimate. Histologic type: Invasive endometrioid carcinoma with squamous differentiation. Grade: FIGO grade I. Myometrial invasion: 0.5 cm where myometrium is 1.5 cm in thickness Cervical stromal involvement: No. Extent of involvement of other organs: No. Lymph vascular invasion: Not identified. Lymph nodes: number examined - 12 ; number positive - 0  No micro satellite instability identified in the malignancy.  Past Surgical Hx:  Past Surgical History  Procedure Laterality Date  . Thyroidectomy    . Oophorectomy  2005  . Tonsillectomy  1969  . Wisdom tooth extraction  1969  . Tubal ligation  1987  . Cataract extraction      2008-right eye, 2010- left eye  . Root canal  2013  . Robotic assisted total hysterectomy with bilateral salpingo oopherectomy Left 10-Apr-2013    Procedure:  ROBOTIC ASSISTED TOTAL HYSTERECTOMY WITH LEFT SALPINGO OOPHORECTOMY;  Surgeon: Jennifer Morning, MD;  Location: [facility];  Service: Gynecology;  Laterality: Left;  . Lymph node dissection N/A April 10, 2013    Procedure: LYMPH NODE DISSECTION;  Surgeon: Jennifer Morning, MD;  Location: [facility];  Service: Gynecology;  Laterality: N/A;  RSO R ovarian fibroma .2005  Past Medical Hx:  Past Medical History  Diagnosis Date  . Chest pain   . Paroxysmal supraventricular tachycardia   . Tobacco dependence   . Ulcerative proctitis   . Hypothyroidism   . Allergic rhinitis   . Elevated LDL cholesterol level   . Hypertension   . Broken foot 2002    left  . Arthritis   . Anemia     hx of     Past Gynecological History: G2 P2 menarche at age of 35 with regular menses menopause occurred in 2002 reports at least 10 years use of oral contraceptive pills  Family Hx:  Family History  Problem Relation Age of Onset  . Cerebral aneurysm Mother   . Colon cancer Father     Review of Systems: Constitutional  Feels well,   Cardiovascular  No chest pain, shortness of breath, or edema  Pulmonary  No cough or wheeze.  Gastro Intestinal  No nausea, vomitting, or diarrhoea. No bright red blood per rectum, no abdominal pain, change in bowel movement, Genito Urinary  No frequency, urgency, dysuria, no vaginal bleeding or discharge Musculo Skeletal  No myalgia, arthralgia, joint swelling or pain  Neurologic  No weakness, numbness, change in gait,  Vitals: BP 134/79  Pulse 102  Temp(Src) 98.4 F (36.9 C)  Resp 18  Ht 5' 4.29" (1.633 m)  Wt 168 lb 12.8 oz (76.567 kg)  BMI 28.71 kg/m2  Physical Exam: WD in NAD Neck  Supple NROM, without any enlargements.  Lymph Node Survey No cervical supraclavicular or inguinal adenopathy Cardiovascular  Pulse normal rate, regularity and rhythm. S1 and S2 normal.  Lungs  Clear to auscultation bilateraly,  Psychiatry  Alert and oriented to person, place, and  time  Abdomen  Normoactive bowel sounds, abdomen soft, non-tender and obese.  Back No CVA tenderness Genito Urinary  Vulva/vagina: Normal external female genitalia.  No lesions. No discharge or bleeding.  Bladder/urethra:  No lesions or masses  Vagina: atrophic, no bleeding or discharge  cuff intact no lesions Good tone, no masses no cul de sac nodularity.  Extremities  No bilateral cyanosis, clubbing or edema.   Jennifer Morning, MD, PhD 01/03/2014, 1:34 PM

## 2014-02-11 ENCOUNTER — Encounter: Payer: Self-pay | Admitting: Gynecologic Oncology

## 2014-05-04 ENCOUNTER — Other Ambulatory Visit: Payer: Self-pay | Admitting: Cardiology

## 2014-05-08 ENCOUNTER — Ambulatory Visit (INDEPENDENT_AMBULATORY_CARE_PROVIDER_SITE_OTHER): Payer: BC Managed Care – PPO | Admitting: Cardiology

## 2014-05-08 ENCOUNTER — Encounter: Payer: Self-pay | Admitting: Cardiology

## 2014-05-08 VITALS — BP 134/80 | HR 83 | Ht 65.0 in | Wt 163.0 lb

## 2014-05-08 DIAGNOSIS — Z720 Tobacco use: Secondary | ICD-10-CM

## 2014-05-08 DIAGNOSIS — E039 Hypothyroidism, unspecified: Secondary | ICD-10-CM

## 2014-05-08 DIAGNOSIS — I471 Supraventricular tachycardia: Secondary | ICD-10-CM

## 2014-05-08 DIAGNOSIS — F17200 Nicotine dependence, unspecified, uncomplicated: Secondary | ICD-10-CM

## 2014-05-08 DIAGNOSIS — I491 Atrial premature depolarization: Secondary | ICD-10-CM

## 2014-05-08 NOTE — Progress Notes (Signed)
[place]. [address], [address], [place]  [address] Phone: [phone] Fax:  [phone]  Date:  05/08/2014   ID:  Jennifer Sexton, Jennifer Sexton [birth year], MRN [id number]  PCP:   Melinda Crutch, MD   History of Present Illness: Jennifer Sexton is a 63 y.o. female with hypertension, hypothyroidism, smoker with PSVT on Holter monitor 10/09 showed rare PACs, 6 of them, with no adverse arrhythmias.  She is on the diltiazem CD 180 mg once a day. At prior appt visit we stopped her metoprolol 50 mg once a day and started diltiazem as above. She felt as though she was having some fatigue or decreased energy levels on Bb. Energy level is improved off of beta blocker.  In regards to tobacco use, she is down to 5 per week, (helps her ulcerative proctitis). She smoked a few more during tax season.  Rare palpitations. Diltiazem is doing a very good job at controlling the heart rate.  No syncope, no bleeding. She did have stage I uterine cancer and hysterectomy. Overall reassuring prognosis.  In regards to hypothyroidism, Dr. Harrington Challenger has is under good control. She is now retired. Take her husband. Prior Dr. Leonia Reeves patient.   Wt Readings from Last 3 Encounters:  05/08/14 163 lb (73.936 kg)  01/03/14 168 lb 12.8 oz (76.567 kg)  05/29/13 161 lb 6.4 oz (73.211 kg)     Past Medical History  Diagnosis Date  . Chest pain   . Paroxysmal supraventricular tachycardia   . Tobacco dependence   . Ulcerative proctitis   . Hypothyroidism   . Allergic rhinitis   . Elevated LDL cholesterol level   . Hypertension   . Broken foot 2002    left  . Arthritis   . Anemia     hx of     Past Surgical History  Procedure Laterality Date  . Thyroidectomy    . Oophorectomy  2005  . Tonsillectomy  1969  . Wisdom tooth extraction  1969  . Tubal ligation  1987  . Cataract extraction      2008-right eye, 2010- left eye  . Root canal  2013  . Robotic assisted total hysterectomy with bilateral salpingo  oopherectomy Left 04/03/2013    Procedure: ROBOTIC ASSISTED TOTAL HYSTERECTOMY WITH LEFT SALPINGO OOPHORECTOMY;  Surgeon: Janie Morning, MD;  Location: [facility];  Service: Gynecology;  Laterality: Left;  . Lymph node dissection N/A 04/03/2013    Procedure: LYMPH NODE DISSECTION;  Surgeon: Janie Morning, MD;  Location: [facility];  Service: Gynecology;  Laterality: N/A;    Current Outpatient Prescriptions  Medication Sig Dispense Refill  . Calcium Carbonate-Vitamin D (CALCIUM + D PO) Take by mouth daily.      Marland Kitchen DILT-XR 180 MG 24 hr capsule TAKE 1 CAPSULE DAILY  90 capsule  0  . Glucosamine 500 MG CAPS Take 2,000 mg by mouth daily.      Marland Kitchen levothyroxine (SYNTHROID, LEVOTHROID) 100 MCG tablet Take 50-100 mcg by mouth every morning. Takes 1/2 on Monday       No current facility-administered medications for this visit.    Allergies:   No Known Allergies  Social History:  The patient  reports that she has been smoking Cigarettes.  She has a 11.75 pack-year smoking history. She does not have any smokeless tobacco history on file. She reports that she drinks about 1.2 ounces of alcohol per week. She reports that she does not use illicit drugs.   Family History  Problem Relation Age of Onset  . Cerebral aneurysm Mother   . Colon cancer Father     ROS:  Please see the history of present illness.   Denies any fevers, chills, orthopnea, PND  All other systems reviewed and negative.   PHYSICAL EXAM: VS:  BP 134/80  Pulse 83  Ht 5\' 5"  (1.651 m)  Wt 163 lb (73.936 kg)  BMI 27.12 kg/m2 Well nourished, well developed, in no acute distress HEENT: normal, [place]/AT, EOMI Neck: no JVD, normal carotid upstroke, no bruit Cardiac:  normal S1, S2; RRR; no murmur Lungs:  clear to auscultation bilaterally, no wheezing, rhonchi or rales Abd: soft, nontender, no hepatomegaly, no bruits Ext: no edema, 2+ distal pulses Skin: warm and dry GU: deferred Neuro: no focal abnormalities noted, AAO x 3  EKG:   05/08/14-sinus rhythm, sinus arrhythmia, 83, poor R wave progression   ECHO: 09/16/08 -  Normal left ventricular size and function. Left ventricular ejection fraction estimated by 2D at 60-65 percent. Grade 1 diastolic dysfunction Normal right ventricular size and function. No significant valvular disease.   ASSESSMENT AND PLAN:  1. PSVT - doing well. No symptoms. 2. Rare PAC's - diltiazem helping to suppress. Rare palpitations. 3. Hypothyroidism-currently controlled, monitored. Can play a role in palpitations. 4. Tobacco use - she states that she smokes approximately one cigarette a day and this keeps her ulcerative colitis in check. When she stops, she bleeds. Still overall encourage tobacco cessation from a cardiovascular perspective. Perhaps gastroenterology has improved solution.  Signed, Candee Furbish, MD [facility]  05/08/2014 9:44 AM

## 2014-05-08 NOTE — Patient Instructions (Signed)
Your physician recommends that you continue on your current medications as directed. Please refer to the Current Medication list given to you today.  Your physician wants you to follow-up in: 1 year. You will receive a reminder letter in the mail two months in advance. If you don't receive a letter, please call our office to schedule the follow-up appointment.  

## 2014-07-13 ENCOUNTER — Other Ambulatory Visit: Payer: Self-pay | Admitting: Cardiology

## 2014-08-06 ENCOUNTER — Encounter: Payer: Self-pay | Admitting: Internal Medicine

## 2014-10-02 ENCOUNTER — Other Ambulatory Visit: Payer: Self-pay | Admitting: Family Medicine

## 2014-10-02 ENCOUNTER — Other Ambulatory Visit (HOSPITAL_COMMUNITY)
Admission: RE | Admit: 2014-10-02 | Discharge: 2014-10-02 | Disposition: A | Discharge status: Home / Self Care | Payer: BC Managed Care – PPO | Source: Ambulatory Visit | Attending: Family Medicine | Admitting: Family Medicine

## 2014-10-02 DIAGNOSIS — Z8542 Personal history of malignant neoplasm of other parts of uterus: Secondary | ICD-10-CM | POA: Diagnosis not present

## 2014-10-03 LAB — CYTOLOGY - PAP

## 2014-11-12 ENCOUNTER — Other Ambulatory Visit (HOSPITAL_COMMUNITY): Payer: Self-pay | Admitting: Family Medicine

## 2014-11-12 DIAGNOSIS — Z1231 Encounter for screening mammogram for malignant neoplasm of breast: Secondary | ICD-10-CM

## 2014-11-28 ENCOUNTER — Ambulatory Visit (HOSPITAL_COMMUNITY)
Admission: RE | Admit: 2014-11-28 | Discharge: 2014-11-28 | Disposition: A | Discharge status: Home / Self Care | Payer: BC Managed Care – PPO | Source: Ambulatory Visit | Attending: Family Medicine | Admitting: Family Medicine

## 2014-11-28 DIAGNOSIS — Z1231 Encounter for screening mammogram for malignant neoplasm of breast: Secondary | ICD-10-CM | POA: Diagnosis present

## 2015-01-09 ENCOUNTER — Other Ambulatory Visit: Payer: Self-pay | Admitting: Cardiology

## 2015-03-10 ENCOUNTER — Encounter: Payer: Self-pay | Admitting: Internal Medicine

## 2015-06-05 ENCOUNTER — Encounter: Payer: Self-pay | Admitting: Internal Medicine

## 2015-07-15 DIAGNOSIS — N362 Urethral caruncle: Secondary | ICD-10-CM | POA: Diagnosis not present

## 2015-07-15 DIAGNOSIS — N760 Acute vaginitis: Secondary | ICD-10-CM | POA: Diagnosis not present

## 2015-07-21 ENCOUNTER — Ambulatory Visit (AMBULATORY_SURGERY_CENTER): Payer: Self-pay

## 2015-07-21 VITALS — Ht 63.0 in | Wt 168.8 lb

## 2015-07-21 DIAGNOSIS — Z800 Family history of malignant neoplasm of digestive organs: Secondary | ICD-10-CM

## 2015-07-21 MED ORDER — SUPREP BOWEL PREP KIT 17.5-3.13-1.6 GM/177ML PO SOLN: 1.0000 | Freq: Once | ORAL | Status: DC

## 2015-07-21 NOTE — Progress Notes (Signed)
No allergies to eggs or soy No diet/weight loss meds No home oxygen No past problems with anesthesia  Has email  Emmi instructions given for colonoscopy 

## 2015-07-28 ENCOUNTER — Ambulatory Visit: Payer: Self-pay | Admitting: Cardiology

## 2015-08-04 ENCOUNTER — Encounter: Payer: Self-pay | Admitting: Internal Medicine

## 2015-08-04 ENCOUNTER — Ambulatory Visit (AMBULATORY_SURGERY_CENTER): Payer: Commercial Managed Care - HMO | Admitting: Internal Medicine

## 2015-08-04 VITALS — BP 123/80 | HR 73 | Temp 96.5°F | Resp 12 | Ht 65.0 in | Wt 163.0 lb

## 2015-08-04 DIAGNOSIS — Z1211 Encounter for screening for malignant neoplasm of colon: Secondary | ICD-10-CM

## 2015-08-04 DIAGNOSIS — Z800 Family history of malignant neoplasm of digestive organs: Secondary | ICD-10-CM

## 2015-08-04 DIAGNOSIS — I10 Essential (primary) hypertension: Secondary | ICD-10-CM | POA: Diagnosis not present

## 2015-08-04 HISTORY — PX: COLONOSCOPY: SHX174

## 2015-08-04 MED ORDER — SODIUM CHLORIDE 0.9 % IV SOLN: 500.0000 mL | INTRAVENOUS | Status: DC

## 2015-08-04 NOTE — Patient Instructions (Signed)
YOU HAD AN ENDOSCOPIC PROCEDURE TODAY AT [facility]:   Refer to the procedure report that was given to you for any specific questions about what was found during the examination.  If the procedure report does not answer your questions, please call your gastroenterologist to clarify.  If you requested that your care partner not be given the details of your procedure findings, then the procedure report has been included in a sealed envelope for you to review at your convenience later.  YOU SHOULD EXPECT: Some feelings of bloating in the abdomen. Passage of more gas than usual.  Walking can help get rid of the air that was put into your GI tract during the procedure and reduce the bloating. If you had a lower endoscopy (such as a colonoscopy or flexible sigmoidoscopy) you may notice spotting of blood in your stool or on the toilet paper. If you underwent a bowel prep for your procedure, you may not have a normal bowel movement for a few days.  Please Note:  You might notice some irritation and congestion in your nose or some drainage.  This is from the oxygen used during your procedure.  There is no need for concern and it should clear up in a day or so.  SYMPTOMS TO REPORT IMMEDIATELY:   Following lower endoscopy (colonoscopy or flexible sigmoidoscopy):  Excessive amounts of blood in the stool  Significant tenderness or worsening of abdominal pains  Swelling of the abdomen that is new, acute  Fever of 100F or higher    Black, tarry-looking stools  For urgent or emergent issues, a gastroenterologist can be reached at any hour by calling [phone].   DIET: Your first meal following the procedure should be a small meal and then it is ok to progress to your normal diet. Heavy or fried foods are harder to digest and may make you feel nauseous or bloated.  Likewise, meals heavy in dairy and vegetables can increase bloating.  Drink plenty of fluids but you should avoid alcoholic  beverages for 24 hours.  ACTIVITY:  You should plan to take it easy for the rest of today and you should NOT DRIVE or use heavy machinery until tomorrow (because of the sedation medicines used during the test).    FOLLOW UP: Our staff will call the number listed on your records the next business day following your procedure to check on you and address any questions or concerns that you may have regarding the information given to you following your procedure. If we do not reach you, we will leave a message.  However, if you are feeling well and you are not experiencing any problems, there is no need to return our call.  We will assume that you have returned to your regular daily activities without incident.  If any biopsies were taken you will be contacted by phone or by letter within the next 1-3 weeks.  Please call us at [phone] if you have not heard about the biopsies in 3 weeks.    SIGNATURES/CONFIDENTIALITY: You and/or your care partner have signed paperwork which will be entered into your electronic medical record.  These signatures attest to the fact that that the information above on your After Visit Summary has been reviewed and is understood.  Full responsibility of the confidentiality of this discharge information lies with you and/or your care-partner.

## 2015-08-04 NOTE — Op Note (Signed)
[place]  [address]. [place], [address]   COLONOSCOPY PROCEDURE REPORT  PATIENT: Jennifer Sexton, Jennifer Sexton  MR#: [id number] BIRTHDATE: May 25, 1951 , 61  yrs. old GENDER: female ENDOSCOPIST: Jerene Bears, MD PROCEDURE DATE:  08/04/2015 PROCEDURE:   Colonoscopy, screening First Screening Colonoscopy - Avg.  risk and is 50 yrs.  old or older - No.  Prior Negative Screening - Now for repeat screening. Less than 10 yrs Prior Negative Screening - Now for repeat screening.  Above average risk  History of Adenoma - Now for follow-up colonoscopy & has been > or = to 3 yrs.  N/A  Polyps removed today? No Recommend repeat exam, <10 yrs? Yes high risk ASA CLASS:   Class III INDICATIONS:Screening for colonic neoplasia and FH of colon cancer in father, last colonoscopy in 2010. MEDICATIONS: Propofol 250 mg IV and Monitored anesthesia care  DESCRIPTION OF PROCEDURE:   After the risks benefits and alternatives of the procedure were thoroughly explained, informed consent was obtained.  The digital rectal exam revealed no rectal mass.   The LB [id number]  endoscope was introduced through the anus and advanced to the terminal ileum which was intubated for a short distance. No adverse events experienced.   The quality of the prep was good.  (Suprep was used)  The instrument was then slowly withdrawn as the colon was fully examined. Estimated blood loss is zero unless otherwise noted in this procedure report.      COLON FINDINGS: The examined terminal ileum appeared to be normal. The colonic mucosa appeared normal throughout the entire examined colon.  No evidence for colitis or proctitis today.  No polyps or cancer seen.  Retroflexed views revealed internal hemorrhoids. The time to cecum = 4.9 Withdrawal time = 7.1   The scope was withdrawn and the procedure completed.  COMPLICATIONS: There were no immediate complications.  ENDOSCOPIC IMPRESSION: 1.   The examined  terminal ileum appeared to be normal 2.   The colonic mucosa appeared normal throughout the entire examined colon  RECOMMENDATIONS: 1.  Repeat Colonoscopy in 5 years. 2.  Call for office follow-up if proctitis symptoms return  eSigned:  Jerene Bears, MD 08/04/2015 2:23 PM   cc: Lona Kettle MD and The Patient

## 2015-08-04 NOTE — Progress Notes (Signed)
Transferred to recovery room. A/O x3, pleased with MAC.  VSS.  Report to Penny, RN. 

## 2015-08-05 ENCOUNTER — Telehealth: Payer: Self-pay | Admitting: Emergency Medicine

## 2015-08-05 NOTE — Telephone Encounter (Signed)
  Follow up Call-  Call back number 08/04/2015  Post procedure Call Back phone  # [phone]  Permission to leave phone message Yes     Patient questions:  Do you have a fever, pain , or abdominal swelling? No. Pain Score  0 *  Have you tolerated food without any problems? Yes.    Have you been able to return to your normal activities? Yes.    Do you have any questions about your discharge instructions: Diet   No. Medications  No. Follow up visit  No.  Do you have questions or concerns about your Care? No.  Actions: * If pain score is 4 or above: No action needed, pain <4.

## 2015-08-08 ENCOUNTER — Ambulatory Visit (INDEPENDENT_AMBULATORY_CARE_PROVIDER_SITE_OTHER): Payer: Commercial Managed Care - HMO | Admitting: Cardiology

## 2015-08-08 ENCOUNTER — Encounter: Payer: Self-pay | Admitting: Cardiology

## 2015-08-08 VITALS — BP 114/72 | HR 69 | Ht 64.0 in | Wt 169.5 lb

## 2015-08-08 DIAGNOSIS — I471 Supraventricular tachycardia: Secondary | ICD-10-CM | POA: Diagnosis not present

## 2015-08-08 DIAGNOSIS — Z720 Tobacco use: Secondary | ICD-10-CM | POA: Diagnosis not present

## 2015-08-08 DIAGNOSIS — R002 Palpitations: Secondary | ICD-10-CM

## 2015-08-08 NOTE — Patient Instructions (Signed)
Medication Instructions:  Your physician recommends that you continue on your current medications as directed. Please refer to the Current Medication list given to you today.  Follow-Up: Follow up in 1 year with Dr. Skains.  You will receive a letter in the mail 2 months before you are due.  Please call us when you receive this letter to schedule your follow up appointment.  Thank you for choosing [facility]!!       

## 2015-08-08 NOTE — Progress Notes (Signed)
[place]. [address], [place]  [address] Phone: [phone] Fax:  [phone]  Date:  08/08/2015   ID:  Jennifer Sexton, Jennifer Sexton 08/09/1951, MRN [id number]  PCP:   Melinda Crutch, MD   History of Present Illness: Jennifer Sexton is a 64 y.o. female with hypertension, hypothyroidism, smoker with PSVT on Holter monitor 10/09 showed rare PACs, 6 of them, with no adverse arrhythmias.  She is on the diltiazem CD 180 mg once a day. At prior appt visit we stopped her metoprolol 50 mg once a day and started diltiazem as above. She felt as though she was having some fatigue or decreased energy levels on Bb. Energy level is improved off of beta blocker.  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 She smoked a few more during tax season.  Rare palpitations. Diltiazem is doing a very good job at controlling the heart rate.  No syncope, no bleeding. She did have stage I uterine cancer and hysterectomy. Overall reassuring prognosis. she saw OB/GYN and was given good results.   In regards to hypothyroidism, Dr. Harrington Challenger has is under good control.  She is now retired. No change from prior visit.   Wt Readings from Last 3 Encounters:  08/08/15 169 lb 8 oz (76.885 kg)  08/04/15 163 lb (73.936 kg)  07/21/15 168 lb 12.8 oz (76.567 kg)     Past Medical History  Diagnosis Date  . Chest pain   . Paroxysmal supraventricular tachycardia   . Tobacco dependence   . Ulcerative proctitis   . Hypothyroidism   . Allergic rhinitis   . Elevated LDL cholesterol level   . Hypertension   . Broken foot 2002    left  . Arthritis   . Anemia     hx of     Past Surgical History  Procedure Laterality Date  . Thyroidectomy    . Oophorectomy  2005  . Tonsillectomy  1969  . Wisdom tooth extraction  1969  . Tubal ligation  1987  . Cataract extraction      2008-right eye, 2010- left eye  . Root canal  2013  . Robotic assisted total hysterectomy  with bilateral salpingo oopherectomy Left 04/03/2013    Procedure: ROBOTIC ASSISTED TOTAL HYSTERECTOMY WITH LEFT SALPINGO OOPHORECTOMY;  Surgeon: Janie Morning, MD;  Location: [facility];  Service: Gynecology;  Laterality: Left;  . Lymph node dissection N/A 04/03/2013    Procedure: LYMPH NODE DISSECTION;  Surgeon: Janie Morning, MD;  Location: [facility];  Service: Gynecology;  Laterality: N/A;    Current Outpatient Prescriptions  Medication Sig Dispense Refill  . Calcium 500 MG CHEW Chew 1 tablet by mouth.    . Cholecalciferol (VITAMIN D3) 2000 UNITS TABS Take by mouth.    Marland Kitchen DILT-XR 180 MG 24 hr capsule TAKE 1 CAPSULE DAILY 90 capsule 0  . levothyroxine (SYNTHROID, LEVOTHROID) 100 MCG tablet Take 50-100 mcg by mouth every morning. Takes 1/2 on Monday     No current facility-administered medications for this visit.    Allergies:   No Known Allergies  Social History:  The patient  reports that she has been smoking Cigarettes.  She has a 11.75 pack-year smoking history. She has never used smokeless tobacco. She reports that she drinks about 8.4 oz of alcohol per week. She reports that she does not use illicit drugs.   Family History  Problem Relation Age of Onset  .  Cerebral aneurysm Mother   . Colon cancer Father     ROS:  Please see the history of present illness.   Denies any fevers, chills, orthopnea, PND  All other systems reviewed and negative.   PHYSICAL EXAM: VS:  BP 114/72 mmHg  Pulse 69  Ht 5\' 4"  (1.626 m)  Wt 169 lb 8 oz (76.885 kg)  BMI 29.08 kg/m2 Well nourished, well developed, in no acute distress HEENT: normal, [place]/AT, EOMI Neck: no JVD, normal carotid upstroke, no bruit Cardiac:  normal S1, S2; RRR; no murmur Lungs:  clear to auscultation bilaterally, no wheezing, rhonchi or rales Abd: soft, nontender, no hepatomegaly, no bruits Ext: no edema, 2+ distal pulses Skin: warm and dry GU: deferred Neuro: no focal abnormalities noted, AAO x 3  EKG:  Today, 08/08/15-sinus  rhythm, 69, no other abnormalities. Personally viewed 05/08/14-sinus rhythm, sinus arrhythmia, 83, poor R wave progression    ECHO: 09/16/08 -  Normal left ventricular size and function. Left ventricular ejection fraction estimated by 2D at 60-65 percent. Grade 1 diastolic dysfunction Normal right ventricular size and function. No significant valvular disease.   ASSESSMENT AND PLAN:  1. PSVT - doing well. No symptoms. Continue with diltiazem. 2. Rare PAC's - diltiazem helping to suppress. Rare palpitations. 3. Hypothyroidism-currently controlled, monitored. Can play a role in palpitations. 4. Tobacco use - she states that she smokes approximately one cigarette every other day and this keeps her ulcerative colitis in check. When she stops, she bleeds. Still overall encourage tobacco cessation from a cardiovascular perspective.  Colonoscopy looked great.  5. One-year follow-up.  Signed, Candee Furbish, MD [facility]  08/08/2015 8:51 AM

## 2015-08-14 ENCOUNTER — Other Ambulatory Visit: Payer: Self-pay

## 2015-08-14 MED ORDER — DILTIAZEM HCL ER 180 MG PO CP24: 180.0000 mg | ORAL_CAPSULE | Freq: Every day | ORAL | Status: DC

## 2015-09-04 DIAGNOSIS — R350 Frequency of micturition: Secondary | ICD-10-CM | POA: Diagnosis not present

## 2015-10-20 DIAGNOSIS — Z13220 Encounter for screening for lipoid disorders: Secondary | ICD-10-CM | POA: Diagnosis not present

## 2015-10-20 DIAGNOSIS — Z0000 Encounter for general adult medical examination without abnormal findings: Secondary | ICD-10-CM | POA: Diagnosis not present

## 2015-10-20 DIAGNOSIS — Z131 Encounter for screening for diabetes mellitus: Secondary | ICD-10-CM | POA: Diagnosis not present

## 2015-10-20 DIAGNOSIS — E039 Hypothyroidism, unspecified: Secondary | ICD-10-CM | POA: Diagnosis not present

## 2015-10-21 DIAGNOSIS — Z23 Encounter for immunization: Secondary | ICD-10-CM | POA: Diagnosis not present

## 2015-10-21 DIAGNOSIS — Z136 Encounter for screening for cardiovascular disorders: Secondary | ICD-10-CM | POA: Diagnosis not present

## 2015-10-21 DIAGNOSIS — E039 Hypothyroidism, unspecified: Secondary | ICD-10-CM | POA: Diagnosis not present

## 2015-10-21 DIAGNOSIS — Z131 Encounter for screening for diabetes mellitus: Secondary | ICD-10-CM | POA: Diagnosis not present

## 2016-02-16 DIAGNOSIS — H43391 Other vitreous opacities, right eye: Secondary | ICD-10-CM | POA: Diagnosis not present

## 2016-02-16 DIAGNOSIS — Z961 Presence of intraocular lens: Secondary | ICD-10-CM | POA: Diagnosis not present

## 2016-03-02 ENCOUNTER — Encounter: Payer: Self-pay | Admitting: Gastroenterology

## 2016-05-03 ENCOUNTER — Other Ambulatory Visit: Payer: Self-pay

## 2016-05-03 ENCOUNTER — Telehealth: Payer: Self-pay | Admitting: Internal Medicine

## 2016-05-03 DIAGNOSIS — R928 Other abnormal and inconclusive findings on diagnostic imaging of breast: Secondary | ICD-10-CM

## 2016-05-03 NOTE — Telephone Encounter (Signed)
Pt states she has been having some blood in her stool especially in the am. Reports bloody mucous and abdominal pain along with urgency. States this has been going on for about 2 weeks. Pt scheduled to see Amy Esterwood PA 05/05/16@1 :30pm. Pt aware of appt.

## 2016-05-05 ENCOUNTER — Ambulatory Visit (INDEPENDENT_AMBULATORY_CARE_PROVIDER_SITE_OTHER): Payer: Commercial Managed Care - HMO | Admitting: Physician Assistant

## 2016-05-05 ENCOUNTER — Other Ambulatory Visit (INDEPENDENT_AMBULATORY_CARE_PROVIDER_SITE_OTHER): Payer: Commercial Managed Care - HMO

## 2016-05-05 ENCOUNTER — Encounter: Payer: Self-pay | Admitting: Physician Assistant

## 2016-05-05 VITALS — BP 106/72 | HR 96 | Ht 64.0 in | Wt 169.1 lb

## 2016-05-05 DIAGNOSIS — K625 Hemorrhage of anus and rectum: Secondary | ICD-10-CM

## 2016-05-05 DIAGNOSIS — K5190 Ulcerative colitis, unspecified, without complications: Secondary | ICD-10-CM

## 2016-05-05 LAB — BASIC METABOLIC PANEL
BUN: 19 mg/dL (ref 6–23)
CHLORIDE: 107 meq/L (ref 96–112)
CO2: 29 meq/L (ref 19–32)
CREATININE: 0.74 mg/dL (ref 0.40–1.20)
Calcium: 9.2 mg/dL (ref 8.4–10.5)
GFR: 83.52 mL/min (ref >60.00)
Glucose, Bld: 116 mg/dL — ABNORMAL HIGH (ref 70–99)
POTASSIUM: 3.9 meq/L (ref 3.5–5.1)
Sodium: 141 mEq/L (ref 135–145)

## 2016-05-05 LAB — CBC WITH DIFFERENTIAL/PLATELET
BASOS ABS: 0 10*3/uL (ref 0.0–0.1)
Basophils Relative: 0.6 % (ref 0.0–3.0)
EOS ABS: 0 10*3/uL (ref 0.0–0.7)
Eosinophils Relative: 0.6 % (ref 0.0–5.0)
HEMATOCRIT: 35.8 % — AB (ref 36.0–46.0)
Hemoglobin: 11.9 g/dL — ABNORMAL LOW (ref 12.0–15.0)
LYMPHS PCT: 25.7 % (ref 12.0–46.0)
Lymphs Abs: 1.7 10*3/uL (ref 0.7–4.0)
MCHC: 33.3 g/dL (ref 30.0–36.0)
MCV: 93.9 fl (ref 78.0–100.0)
MONOS PCT: 8.9 % (ref 3.0–12.0)
Monocytes Absolute: 0.6 10*3/uL (ref 0.1–1.0)
Neutro Abs: 4.3 10*3/uL (ref 1.4–7.7)
Neutrophils Relative %: 64.2 % (ref 43.0–77.0)
Platelets: 276 10*3/uL (ref 150.0–400.0)
RBC: 3.81 Mil/uL — AB (ref 3.87–5.11)
RDW: 16 % — ABNORMAL HIGH (ref 11.5–15.5)
WBC: 6.6 10*3/uL (ref 4.0–10.5)

## 2016-05-05 LAB — C-REACTIVE PROTEIN: CRP: 0.6 mg/dL (ref 0.5–20.0)

## 2016-05-05 MED ORDER — MESALAMINE 1.2 G PO TBEC: DELAYED_RELEASE_TABLET | ORAL | Status: DC

## 2016-05-05 MED ORDER — MESALAMINE 1000 MG RE SUPP: 1000.0000 mg | Freq: Every day | RECTAL | Status: DC

## 2016-05-05 NOTE — Patient Instructions (Addendum)
Please go to the basement level to have your labs drawn.  We sent prescriptions to [facility]. 1. Lialda 1.2 g  2. Canasa Suppositories  Follow up with Nicoletta Ba PA or Dr. Hilarie Fredrickson as needed.

## 2016-05-05 NOTE — Progress Notes (Addendum)
Patient ID: Jennifer Sexton, female   DOB: [birth year], 65 y.o.   MRN: [id number]   Subjective:    Patient ID: Jennifer Sexton, female    DOB: 09-12-1951, 65 y.o.   MRN: [id number]  HPI  Jennifer Sexton is a very nice 65 year old white female known to Dr. Hilarie Fredrickson. She has history of ulcerative proctitis/colitis. Her disease has been quiescent for several years and she has not been on any medication. Other medical problems include PSVT, and history of endometrial cancer. Last colonoscopy had been done in August 2016 and was a normal exam. She does have family history of colon cancer in her father. Last time she had active disease on colonoscopy was in 2006 at which time she had proctitis and biopsy showing patchy active colitis. She comes in today stating that she's had a flare up of symptoms of the past 2-3 weeks. She says she's had some mild flares over the past couple of years which have usually resolved with resumption of smoking. Says she tried quit smoking in 2012 symptoms flared up and she started smoking about every other day and her symptoms settled back down. She had not been doing anything different over the past few weeks, takes occasional NSAIDs but not on a regular basis, no new medications antibiotics illnesses or stress. She says she was on the way home from a trip to [place] when she developed lower abdominal pains very reminiscent of her colitis symptoms and then started noticing bloody mucus with her bowel movements. Her stools been softer than usual "mushy" and a bit more frequent but she's not had any overt diarrhea. No excessive urgency n,o fever or chills, no nausea or vomiting. , Review of Systems Pertinent positive and negative review of systems were noted in the above HPI section.  All other review of systems was otherwise negative.  Outpatient Encounter Prescriptions as of 05/05/2016  Medication Sig  . Calcium 500 MG CHEW Chew 1 tablet by mouth.  . Cholecalciferol (VITAMIN D3) 2000  UNITS TABS Take by mouth.  . diltiazem (DILT-XR) 180 MG 24 hr capsule Take 1 capsule (180 mg total) by mouth daily.  Marland Kitchen levothyroxine (SYNTHROID, LEVOTHROID) 100 MCG tablet Take 50-100 mcg by mouth every morning. Takes 1/2 on Monday  . mesalamine (CANASA) 1000 MG suppository Place 1 suppository (1,000 mg total) rectally at bedtime.  . mesalamine (LIALDA) 1.2 g EC tablet Take 1 tablet by mouth twice daily.   No facility-administered encounter medications on file as of 05/05/2016.   No Known Allergies Patient Active Problem List   Diagnosis Date Noted  . Malignant neoplasm of corpus uteri, except isthmus ([place]) 02/20/2013  . HYPOTHYROIDISM 07/16/2009  . GERD 07/16/2009  . RECTAL BLEEDING 07/16/2009  . TACHYCARDIA 07/16/2009  . COLITIS, ULCERATIVE 07/09/2009   Social History   Social History  . Marital Status: Married    Spouse Name: N/A  . Number of Children: N/A  . Years of Education: N/A   Occupational History  . Not on file.   Social History Main Topics  . Smoking status: Current Some Day Smoker -- 0.25 packs/day for 47 years    Types: Cigarettes  . Smokeless tobacco: Never Used     Comment: info given  . Alcohol Use: 8.4 oz/week    14 Glasses of wine per week  . Drug Use: No  . Sexual Activity: Not on file     Comment: 0.5 pack a week   Other Topics Concern  . Not on file  Social History Narrative    Ms. Koehne's family history includes Cerebral aneurysm in her mother; Colon cancer in her father.      Objective:    Filed Vitals:   05/05/16 1334  BP: 106/72  Pulse: 96    Physical Exam  well-developed older white female in no acute distress, pleasant blood pressure 106/72 pulse 96 height 5 foot 4 weight 169. HEENT ;nontraumatic normocephalic EOMI PERRLA sclera anicteric, Cardiovascula;r regular rate and rhythm with S1-S2, no murmur rub or gallop, Pulmonary; clear bilaterally, Abdomen; soft and very mildly tender bilateral lower quadrants no guarding or rebound no  palpable mass or hepatosplenomegaly, Rectal ;exam not done, Extremities; no clubbing cyanosis or edema skin warm and dry, Neuropsych; mood and affect appropriate     Assessment & Plan:    #1 65 yo female with hx of ulcerative proctitis /colitis .Disease had been in remission for several years and now presents with 2-3 week history of lower abdominal pain, cramping and mucoid blood with bowel movements. Symptoms are very reminiscent of prior colitis symptoms  #2 family history of colon cancer in patient's father, up-to-date with colonoscopy last done in August 2016 and normal #3 GERD #4 hypothyroidism  Plan; check CBC with differential, BMET and CRP Start Canasa suppositories the thousand milligrams by mouth daily at bedtime 1 month Start Lialda 1.2 g by mouth twice a day. Patient is asked to call if she has any worsening of symptoms or if she fails to have improvement per the next few weeks on the above regimen. She'll follow up with Dr. Hilarie Fredrickson or myself as needed   Alfredia Ferguson PA-C 05/05/2016   Cc: Lona Kettle, MD   Addendum: Reviewed and agree with initial management. Jerene Bears, MD

## 2016-05-17 ENCOUNTER — Ambulatory Visit
Admission: RE | Admit: 2016-05-17 | Discharge: 2016-05-17 | Disposition: A | Discharge status: Home / Self Care | Payer: Commercial Managed Care - HMO | Source: Ambulatory Visit

## 2016-05-17 DIAGNOSIS — R928 Other abnormal and inconclusive findings on diagnostic imaging of breast: Secondary | ICD-10-CM

## 2016-05-17 DIAGNOSIS — Z1231 Encounter for screening mammogram for malignant neoplasm of breast: Secondary | ICD-10-CM | POA: Diagnosis not present

## 2016-06-02 ENCOUNTER — Other Ambulatory Visit: Payer: Self-pay | Admitting: Cardiology

## 2016-07-29 ENCOUNTER — Encounter: Payer: Self-pay | Admitting: Cardiology

## 2016-08-10 ENCOUNTER — Ambulatory Visit: Payer: Commercial Managed Care - HMO | Admitting: Cardiology

## 2016-08-11 ENCOUNTER — Encounter: Payer: Self-pay | Admitting: Cardiology

## 2016-08-11 ENCOUNTER — Ambulatory Visit (INDEPENDENT_AMBULATORY_CARE_PROVIDER_SITE_OTHER): Payer: Commercial Managed Care - HMO | Admitting: Cardiology

## 2016-08-11 ENCOUNTER — Ambulatory Visit: Payer: Commercial Managed Care - HMO | Admitting: Cardiology

## 2016-08-11 ENCOUNTER — Encounter (INDEPENDENT_AMBULATORY_CARE_PROVIDER_SITE_OTHER): Payer: Self-pay

## 2016-08-11 VITALS — BP 130/88 | HR 90 | Ht 64.0 in | Wt 167.8 lb

## 2016-08-11 DIAGNOSIS — I10 Essential (primary) hypertension: Secondary | ICD-10-CM | POA: Diagnosis not present

## 2016-08-11 DIAGNOSIS — R002 Palpitations: Secondary | ICD-10-CM

## 2016-08-11 DIAGNOSIS — I471 Supraventricular tachycardia: Secondary | ICD-10-CM | POA: Diagnosis not present

## 2016-08-11 DIAGNOSIS — Z720 Tobacco use: Secondary | ICD-10-CM

## 2016-08-11 NOTE — Patient Instructions (Signed)
Medication Instructions:  Your physician recommends that you continue on your current medications as directed. Please refer to the Current Medication list given to you today.  Follow-Up: Your physician wants you to follow-up in: 2 years with Dr. Marlou Porch.  You will receive a reminder letter in the mail two months in advance. If you don't receive a letter, please call our office to schedule the follow-up appointment.     If you need a refill on your cardiac medications before your next appointment, please call your pharmacy.

## 2016-08-11 NOTE — Progress Notes (Signed)
[facility]. [address], [address], [place]  [address] Phone: [phone] Fax:  [phone]  Date:  08/11/2016   ID:  Jennifer Sexton, Jennifer Sexton 1951-08-01, MRN [id number]  PCP:   Melinda Crutch, MD   History of Present Illness: Jennifer Sexton is a 65 y.o. female with hypertension, hypothyroidism, smoker with PSVT on Holter monitor 10/09 showed rare PACs, 6 of them, with no adverse arrhythmias.  She is on the diltiazem CD 180 mg once a day. At prior appt visit we stopped her metoprolol 50 mg once a day and started diltiazem as above. She felt as though she was having some fatigue or decreased energy levels on Bb. Energy level is improved off of beta blocker.  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 She smoked a few more during tax season.  No syncope, no bleeding. She did have stage I uterine cancer and hysterectomy. Overall reassuring prognosis. she saw OB/GYN and was given good results.   In regards to hypothyroidism, Dr. Harrington Challenger has is under good control.  She is now retired. No change from prior visit.  She was helping a friend who had chemotherapy. Overall doing well. No significant runs of palpitations. Minor skips here in there. No chest pain, no bleeding.  She did have an episode of an ulcerative colitis flare after she quit smoking for 17 days while helping her friend out. She try to resume smoking to see if this would get rid of the flare but it did not. She had to go on mesalamine which she has subsequently stopped she stated. At this point she does not feel as though it is doing much for her and she is worried about the cost and expense.   Wt Readings from Last 3 Encounters:  08/11/16 167 lb 12.8 oz (76.1 kg)  05/05/16 169 lb 2 oz (76.7 kg)  08/08/15 169 lb 8 oz (76.9 kg)     Past Medical History:  Diagnosis Date  . Allergic rhinitis   . Anemia    hx of   . Arthritis   . Broken foot 2002   left  . Chest pain   .  Elevated LDL cholesterol level   . Hypertension   . Hypothyroidism   . Paroxysmal supraventricular tachycardia ([place])   . Tobacco dependence   . Ulcerative proctitis [facility])     Past Surgical History:  Procedure Laterality Date  . CATARACT EXTRACTION     2008-right eye, 2010- left eye  . LYMPH NODE DISSECTION N/A 04/03/2013   Procedure: LYMPH NODE DISSECTION;  Surgeon: Janie Morning, MD;  Location: [facility];  Service: Gynecology;  Laterality: N/A;  . OOPHORECTOMY  2005  . ROBOTIC ASSISTED TOTAL HYSTERECTOMY WITH BILATERAL SALPINGO OOPHERECTOMY Left 04/03/2013   Procedure: ROBOTIC ASSISTED TOTAL HYSTERECTOMY WITH LEFT SALPINGO OOPHORECTOMY;  Surgeon: Janie Morning, MD;  Location: [facility];  Service: Gynecology;  Laterality: Left;  . ROOT CANAL  2013  . THYROIDECTOMY    . TONSILLECTOMY  1969  . TUBAL LIGATION  1987  . WISDOM TOOTH EXTRACTION  1969    Current Outpatient Prescriptions  Medication Sig Dispense Refill  . Calcium 500 MG CHEW Chew 1 tablet by mouth.    . Cholecalciferol (VITAMIN D3) 2000 UNITS TABS Take by mouth.    . diltiazem (DILT-XR) 180 MG 24 hr capsule Take 1 capsule (180 mg total) by mouth daily. 90 capsule 3  . levothyroxine (  SYNTHROID, LEVOTHROID) 100 MCG tablet Take 50-100 mcg by mouth every morning. Takes 1/2 on Monday    . mesalamine (CANASA) 1000 MG suppository Place 1,000 mg rectally at bedtime as needed (per patients request.).    Marland Kitchen mesalamine (LIALDA) 1.2 g EC tablet Take 1.2 g by mouth daily as needed (as per patients request).     No current facility-administered medications for this visit.     Allergies:   No Known Allergies  Social History:  The patient  reports that she has been smoking Cigarettes.  She has a 11.75 pack-year smoking history. She has never used smokeless tobacco. She reports that she drinks about 8.4 oz of alcohol per week . She reports that she does not use drugs.   Family History  Problem Relation Age of Onset  . Cerebral aneurysm  Mother   . Colon cancer Father     ROS:  Please see the history of present illness.   Denies any fevers, chills, orthopnea, PND  All other systems reviewed and negative.   PHYSICAL EXAM: VS:  BP 130/88   Pulse 90   Ht 5\' 4"  (1.626 m)   Wt 167 lb 12.8 oz (76.1 kg)   BMI 28.80 kg/m  Well nourished, well developed, in no acute distress HEENT: normal, [place]/AT, EOMI Neck: no JVD, normal carotid upstroke, no bruit Cardiac:  normal S1, S2; RRR; no murmur Lungs:  clear to auscultation bilaterally, no wheezing, rhonchi or rales Abd: soft, nontender, no hepatomegaly, no bruits Ext: no edema, 2+ distal pulses Skin: warm and dry GU: deferred Neuro: no focal abnormalities noted, AAO x 3  EKG:  Today, 08/11/16-sinus rhythm, 90, no other abnormalities personally viewed-prior 08/08/15-sinus rhythm, 69, no other abnormalities. Personally viewed 05/08/14-sinus rhythm, sinus arrhythmia, 83, poor R wave progression    ECHO: 09/16/08 -  Normal left ventricular size and function. Left ventricular ejection fraction estimated by 2D at 60-65 percent. Grade 1 diastolic dysfunction Normal right ventricular size and function. No significant valvular disease.   ASSESSMENT AND PLAN:  1. PSVT - doing well. No symptoms. Continue with diltiazem. 2. Rare PAC's - diltiazem helping to suppress. Rare palpitations. 3. Hypothyroidism-currently controlled, monitored. Can play a role in palpitations. 4. Tobacco use - she states that she smokes approximately one cigarette every other day and this keeps her ulcerative colitis in check. When she stops, she bleeds. Still overall encourage tobacco cessation from a cardiovascular perspective.   5. Two-year follow-up. She's been quite stable. Please let me know if she needs to be seen sooner.  Signed, Candee Furbish, MD [facility]  08/11/2016 12:00 PM

## 2016-11-15 DIAGNOSIS — Z131 Encounter for screening for diabetes mellitus: Secondary | ICD-10-CM | POA: Diagnosis not present

## 2016-11-15 DIAGNOSIS — E7800 Pure hypercholesterolemia, unspecified: Secondary | ICD-10-CM | POA: Diagnosis not present

## 2016-11-15 DIAGNOSIS — Z23 Encounter for immunization: Secondary | ICD-10-CM | POA: Diagnosis not present

## 2016-11-15 DIAGNOSIS — E039 Hypothyroidism, unspecified: Secondary | ICD-10-CM | POA: Diagnosis not present

## 2016-11-15 DIAGNOSIS — Z0000 Encounter for general adult medical examination without abnormal findings: Secondary | ICD-10-CM | POA: Diagnosis not present

## 2017-02-17 DIAGNOSIS — H26493 Other secondary cataract, bilateral: Secondary | ICD-10-CM | POA: Diagnosis not present

## 2017-02-17 DIAGNOSIS — Z961 Presence of intraocular lens: Secondary | ICD-10-CM | POA: Diagnosis not present

## 2017-02-17 DIAGNOSIS — H5212 Myopia, left eye: Secondary | ICD-10-CM | POA: Diagnosis not present

## 2017-03-02 DIAGNOSIS — H26491 Other secondary cataract, right eye: Secondary | ICD-10-CM | POA: Diagnosis not present

## 2017-03-23 DIAGNOSIS — H26492 Other secondary cataract, left eye: Secondary | ICD-10-CM | POA: Diagnosis not present

## 2017-07-12 ENCOUNTER — Other Ambulatory Visit: Payer: Self-pay | Admitting: Cardiology

## 2017-12-15 DIAGNOSIS — Z0000 Encounter for general adult medical examination without abnormal findings: Secondary | ICD-10-CM | POA: Diagnosis not present

## 2017-12-15 DIAGNOSIS — I471 Supraventricular tachycardia: Secondary | ICD-10-CM | POA: Diagnosis not present

## 2017-12-15 DIAGNOSIS — E7800 Pure hypercholesterolemia, unspecified: Secondary | ICD-10-CM | POA: Diagnosis not present

## 2017-12-15 DIAGNOSIS — Z131 Encounter for screening for diabetes mellitus: Secondary | ICD-10-CM | POA: Diagnosis not present

## 2017-12-15 DIAGNOSIS — E039 Hypothyroidism, unspecified: Secondary | ICD-10-CM | POA: Diagnosis not present

## 2017-12-15 DIAGNOSIS — M8580 Other specified disorders of bone density and structure, unspecified site: Secondary | ICD-10-CM | POA: Diagnosis not present

## 2017-12-20 ENCOUNTER — Other Ambulatory Visit: Payer: Self-pay | Admitting: Family Medicine

## 2017-12-20 DIAGNOSIS — Z1231 Encounter for screening mammogram for malignant neoplasm of breast: Secondary | ICD-10-CM

## 2017-12-20 DIAGNOSIS — M8580 Other specified disorders of bone density and structure, unspecified site: Secondary | ICD-10-CM

## 2018-01-20 ENCOUNTER — Ambulatory Visit
Admission: RE | Admit: 2018-01-20 | Discharge: 2018-01-20 | Disposition: A | Discharge status: Home / Self Care | Payer: Commercial Managed Care - HMO | Source: Ambulatory Visit | Attending: Family Medicine | Admitting: Family Medicine

## 2018-01-20 DIAGNOSIS — Z1231 Encounter for screening mammogram for malignant neoplasm of breast: Secondary | ICD-10-CM

## 2018-01-20 DIAGNOSIS — M8580 Other specified disorders of bone density and structure, unspecified site: Secondary | ICD-10-CM

## 2018-01-20 DIAGNOSIS — Z780 Asymptomatic menopausal state: Secondary | ICD-10-CM | POA: Diagnosis not present

## 2018-01-20 DIAGNOSIS — M8589 Other specified disorders of bone density and structure, multiple sites: Secondary | ICD-10-CM | POA: Diagnosis not present

## 2018-02-20 DIAGNOSIS — H5212 Myopia, left eye: Secondary | ICD-10-CM | POA: Diagnosis not present

## 2018-05-16 ENCOUNTER — Other Ambulatory Visit: Payer: Self-pay | Admitting: Cardiology

## 2018-07-19 ENCOUNTER — Other Ambulatory Visit: Payer: Self-pay | Admitting: Cardiology

## 2018-08-03 ENCOUNTER — Encounter: Payer: Self-pay | Admitting: Cardiology

## 2018-08-03 ENCOUNTER — Ambulatory Visit: Payer: Medicare HMO | Admitting: Cardiology

## 2018-08-03 VITALS — BP 136/84 | HR 90 | Ht 64.0 in | Wt 151.4 lb

## 2018-08-03 DIAGNOSIS — I10 Essential (primary) hypertension: Secondary | ICD-10-CM | POA: Diagnosis not present

## 2018-08-03 DIAGNOSIS — I471 Supraventricular tachycardia, unspecified: Secondary | ICD-10-CM

## 2018-08-03 DIAGNOSIS — Z720 Tobacco use: Secondary | ICD-10-CM | POA: Diagnosis not present

## 2018-08-03 NOTE — Progress Notes (Signed)
[facility]. [address], [address], [place]  [address] Phone: [phone] Fax:  [phone]  Date:  08/03/2018   ID:  Jennifer Sexton, Jennifer Sexton March 20, 1951, MRN [id number]  PCP:  Lawerance Cruel, MD   History of Present Illness: Jennifer Sexton is a 67 y.o. female with hypertension, hypothyroidism, smoker with PSVT on Holter monitor 10/09 showed rare PACs, 6 of them, with no adverse arrhythmias.  She is on the diltiazem CD 180 mg once a day. At prior appt visit we stopped her metoprolol 50 mg once a day and started diltiazem as above. She felt as though she was having some fatigue or decreased energy levels on Bb. Energy level is improved off of beta blocker.  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 She smoked a few more during tax season.  No syncope, no bleeding. She did have stage I uterine cancer and hysterectomy. Overall reassuring prognosis. she saw OB/GYN and was given good results.   In regards to hypothyroidism, Dr. Harrington Challenger has is under good control.  She is now retired. No change from prior visit.  She was helping a friend who had chemotherapy. Overall doing well. No significant runs of palpitations. Minor skips here in there. No chest pain, no bleeding.  She did have an episode of an ulcerative colitis flare after she quit smoking for 17 days while helping her friend out. She try to resume smoking to see if this would get rid of the flare but it did not. She had to go on mesalamine which she has subsequently stopped she stated. At this point she does not feel as though it is doing much for her and she is worried about the cost and expense.  08/03/18 - low carbs. Bread, sugar. Tried intermittent fasting to 11-7.  Rare palpitations.   Wt Readings from Last 3 Encounters:  08/03/18 151 lb 6.4 oz (68.7 kg)  08/11/16 167 lb 12.8 oz (76.1 kg)  05/05/16 169 lb 2 oz (76.7 kg)     Past Medical History:  Diagnosis Date  .  Allergic rhinitis   . Anemia    hx of   . Arthritis   . Broken foot 2002   left  . Chest pain   . Elevated LDL cholesterol level   . Hypertension   . Hypothyroidism   . Paroxysmal supraventricular tachycardia ([place])   . Tobacco dependence   . Ulcerative proctitis [facility])     Past Surgical History:  Procedure Laterality Date  . CATARACT EXTRACTION     2008-right eye, 2010- left eye  . LYMPH NODE DISSECTION N/A 04/03/2013   Procedure: LYMPH NODE DISSECTION;  Surgeon: Janie Morning, MD;  Location: [facility];  Service: Gynecology;  Laterality: N/A;  . OOPHORECTOMY  2005  . ROBOTIC ASSISTED TOTAL HYSTERECTOMY WITH BILATERAL SALPINGO OOPHERECTOMY Left 04/03/2013   Procedure: ROBOTIC ASSISTED TOTAL HYSTERECTOMY WITH LEFT SALPINGO OOPHORECTOMY;  Surgeon: Janie Morning, MD;  Location: [facility];  Service: Gynecology;  Laterality: Left;  . ROOT CANAL  2013  . THYROIDECTOMY    . TONSILLECTOMY  1969  . TUBAL LIGATION  1987  . WISDOM TOOTH EXTRACTION  1969    Current Outpatient Medications  Medication Sig Dispense Refill  . Calcium 500 MG CHEW Chew 1 tablet by mouth.    . Cholecalciferol (VITAMIN D3) 2000 UNITS TABS Take by mouth.    . diltiazem (DILT-XR) 180 MG 24 hr capsule  Take 1 capsule (180 mg total) by mouth daily. Please keep upcoming appointment for further refills 90 capsule 0  . levothyroxine (SYNTHROID, LEVOTHROID) 100 MCG tablet Take 50-100 mcg by mouth every morning. Takes 1/2 on Monday     No current facility-administered medications for this visit.     Allergies:   No Known Allergies  Social History:  The patient  reports that she has been smoking cigarettes. She has a 11.75 pack-year smoking history. She has never used smokeless tobacco. She reports that she drinks about 14.0 standard drinks of alcohol per week. She reports that she does not use drugs.   Family History  Problem Relation Age of Onset  . Cerebral aneurysm Mother   . Colon cancer Father     ROS:  Please see  the history of present illness.   Denies any fevers, chills, orthopnea, PND  All other systems reviewed and negative.   PHYSICAL EXAM: VS:  BP 136/84   Pulse 90   Ht 5\' 4"  (1.626 m)   Wt 151 lb 6.4 oz (68.7 kg)   BMI 25.99 kg/m  GEN: Well nourished, well developed, in no acute distress  HEENT: normal  Neck: no JVD, carotid bruits, or masses Cardiac: RRR; no murmurs, rubs, or gallops,no edema  Respiratory:  clear to auscultation bilaterally, normal work of breathing GI: soft, nontender, nondistended, + BS MS: no deformity or atrophy  Skin: warm and dry, no rash Neuro:  Alert and Oriented x 3, Strength and sensation are intact Psych: euthymic mood, full affect   EKG:  Today, 08/03/2018-sinus rhythm 90 with no other abnormalities personally viewed 08/11/16-sinus rhythm, 90, no other abnormalities personally viewed-prior 08/08/15-sinus rhythm, 69, no other abnormalities. Personally viewed 05/08/14-sinus rhythm, sinus arrhythmia, 83, poor R wave progression    ECHO: 09/16/08 -  Normal left ventricular size and function. Left ventricular ejection fraction estimated by 2D at 60-65 percent. Grade 1 diastolic dysfunction Normal right ventricular size and function. No significant valvular disease.   ASSESSMENT AND PLAN:  1. PSVT - doing well. No symptoms. Continue with diltiazem.  She does have some trouble swallowing the diltiazem capsule.  Overall she has been doing quite well. 2. Rare PAC's - diltiazem helping to suppress. Rare palpitations.  Very rare. 3. Hypothyroidism-currently controlled, monitored. Can play a role in palpitations. 4. Tobacco use - she states that she smokes approximately one cigarette every other day and this keeps her ulcerative colitis in check. When she stops, she bleeds. Still overall encourage tobacco cessation from a cardiovascular perspective.  No changes made. 5. She's been quite stable. Please let me know if she needs to be seen sooner.  We are going to do a  2-year follow-up again.  Signed, Candee Furbish, MD [facility]  08/03/2018 3:26 PM

## 2018-08-03 NOTE — Patient Instructions (Signed)
The current medical regimen is effective;  continue present plan and medications.  Follow-Up: Follow up in 2 years with Dr. Marlou Porch.  You will receive a letter in the mail 2 months before you are due.  Please call us when you receive this letter to schedule your follow up appointment.  Thank you for choosing [facility]!!

## 2018-08-07 ENCOUNTER — Other Ambulatory Visit: Payer: Self-pay | Admitting: *Deleted

## 2018-08-07 MED ORDER — DILTIAZEM HCL ER 180 MG PO CP24: 180.0000 mg | ORAL_CAPSULE | Freq: Every day | ORAL | 3 refills | Status: AC

## 2018-09-22 DIAGNOSIS — Z23 Encounter for immunization: Secondary | ICD-10-CM | POA: Diagnosis not present

## 2019-02-07 DIAGNOSIS — E7800 Pure hypercholesterolemia, unspecified: Secondary | ICD-10-CM | POA: Diagnosis not present

## 2019-02-07 DIAGNOSIS — E039 Hypothyroidism, unspecified: Secondary | ICD-10-CM | POA: Diagnosis not present

## 2019-02-07 DIAGNOSIS — Z131 Encounter for screening for diabetes mellitus: Secondary | ICD-10-CM | POA: Diagnosis not present

## 2019-02-12 DIAGNOSIS — E7800 Pure hypercholesterolemia, unspecified: Secondary | ICD-10-CM | POA: Diagnosis not present

## 2019-02-12 DIAGNOSIS — Z1389 Encounter for screening for other disorder: Secondary | ICD-10-CM | POA: Diagnosis not present

## 2019-02-12 DIAGNOSIS — L570 Actinic keratosis: Secondary | ICD-10-CM | POA: Diagnosis not present

## 2019-02-12 DIAGNOSIS — I471 Supraventricular tachycardia: Secondary | ICD-10-CM | POA: Diagnosis not present

## 2019-02-12 DIAGNOSIS — Z0000 Encounter for general adult medical examination without abnormal findings: Secondary | ICD-10-CM | POA: Diagnosis not present

## 2019-02-12 DIAGNOSIS — M8580 Other specified disorders of bone density and structure, unspecified site: Secondary | ICD-10-CM | POA: Diagnosis not present

## 2019-02-12 DIAGNOSIS — K5120 Ulcerative (chronic) proctitis without complications: Secondary | ICD-10-CM | POA: Diagnosis not present

## 2019-02-12 DIAGNOSIS — E039 Hypothyroidism, unspecified: Secondary | ICD-10-CM | POA: Diagnosis not present

## 2019-02-19 ENCOUNTER — Other Ambulatory Visit: Payer: Self-pay | Admitting: Family Medicine

## 2019-02-19 DIAGNOSIS — Z1231 Encounter for screening mammogram for malignant neoplasm of breast: Secondary | ICD-10-CM

## 2019-02-20 ENCOUNTER — Ambulatory Visit
Admission: RE | Admit: 2019-02-20 | Discharge: 2019-02-20 | Disposition: A | Discharge status: Home / Self Care | Payer: Medicare HMO | Source: Ambulatory Visit | Attending: Family Medicine | Admitting: Family Medicine

## 2019-02-20 DIAGNOSIS — Z1231 Encounter for screening mammogram for malignant neoplasm of breast: Secondary | ICD-10-CM

## 2019-02-26 DIAGNOSIS — H5212 Myopia, left eye: Secondary | ICD-10-CM | POA: Diagnosis not present

## 2019-09-10 DIAGNOSIS — Z23 Encounter for immunization: Secondary | ICD-10-CM | POA: Diagnosis not present

## 2020-01-08 ENCOUNTER — Ambulatory Visit: Payer: Medicare HMO

## 2020-01-17 ENCOUNTER — Ambulatory Visit: Payer: Medicare HMO | Attending: Internal Medicine

## 2020-01-17 DIAGNOSIS — Z23 Encounter for immunization: Secondary | ICD-10-CM | POA: Insufficient documentation

## 2020-01-17 NOTE — Progress Notes (Signed)
   Covid-19 Vaccination Clinic  Name:  Jennifer Sexton    MRN: [id number] DOB: [birth year]  01/17/2020  [name] was observed post Covid-19 immunization for 15 minutes without incidence. She was provided with Vaccine Information Sheet and instruction to access the V-Safe system.   [name] was instructed to call 911 with any severe reactions post vaccine: Marland Kitchen Difficulty breathing  . Swelling of your face and throat  . A fast heartbeat  . A bad rash all over your body  . Dizziness and weakness    Immunizations Administered    Name Date Dose VIS Date Route   Pfizer COVID-19 Vaccine 01/17/2020  9:10 AM 0.3 mL 11/23/2019 Intramuscular   Manufacturer: [place]   Lot: [id number]   [place]: [id number]

## 2020-01-29 ENCOUNTER — Ambulatory Visit: Payer: Medicare HMO

## 2020-02-11 ENCOUNTER — Ambulatory Visit: Payer: Medicare HMO | Attending: Internal Medicine

## 2020-02-11 DIAGNOSIS — Z23 Encounter for immunization: Secondary | ICD-10-CM | POA: Insufficient documentation

## 2020-02-11 NOTE — Progress Notes (Signed)
   Covid-19 Vaccination Clinic  Name:  SOLIMAR RETZER    MRN: [id number] DOB: 1951-02-28  02/11/2020  Ms. Mishkin was observed post Covid-19 immunization for 15 minutes without incidence. She was provided with Vaccine Information Sheet and instruction to access the V-Safe system.   Ms. Ludlam was instructed to call 911 with any severe reactions post vaccine: Marland Kitchen Difficulty breathing  . Swelling of your face and throat  . A fast heartbeat  . A bad rash all over your body  . Dizziness and weakness    Immunizations Administered    Name Date Dose VIS Date Route   Pfizer COVID-19 Vaccine 02/11/2020 12:50 PM 0.3 mL 11/23/2019 Intramuscular   Manufacturer: [place]   Lot: [id number]   [place]: [id number]

## 2020-02-28 DIAGNOSIS — H524 Presbyopia: Secondary | ICD-10-CM | POA: Diagnosis not present

## 2020-02-28 DIAGNOSIS — H5212 Myopia, left eye: Secondary | ICD-10-CM | POA: Diagnosis not present

## 2020-02-28 DIAGNOSIS — H52203 Unspecified astigmatism, bilateral: Secondary | ICD-10-CM | POA: Diagnosis not present

## 2020-03-03 ENCOUNTER — Other Ambulatory Visit: Payer: Self-pay | Admitting: Family Medicine

## 2020-03-03 DIAGNOSIS — Z1231 Encounter for screening mammogram for malignant neoplasm of breast: Secondary | ICD-10-CM

## 2020-03-25 ENCOUNTER — Other Ambulatory Visit: Payer: Self-pay

## 2020-03-25 ENCOUNTER — Ambulatory Visit
Admission: RE | Admit: 2020-03-25 | Discharge: 2020-03-25 | Disposition: A | Discharge status: Home / Self Care | Payer: Medicare HMO | Source: Ambulatory Visit | Attending: Family Medicine | Admitting: Family Medicine

## 2020-03-25 DIAGNOSIS — Z1231 Encounter for screening mammogram for malignant neoplasm of breast: Secondary | ICD-10-CM | POA: Diagnosis not present

## 2020-05-05 DIAGNOSIS — Z0000 Encounter for general adult medical examination without abnormal findings: Secondary | ICD-10-CM | POA: Diagnosis not present

## 2020-05-05 DIAGNOSIS — Z131 Encounter for screening for diabetes mellitus: Secondary | ICD-10-CM | POA: Diagnosis not present

## 2020-05-05 DIAGNOSIS — E7800 Pure hypercholesterolemia, unspecified: Secondary | ICD-10-CM | POA: Diagnosis not present

## 2020-05-05 DIAGNOSIS — E039 Hypothyroidism, unspecified: Secondary | ICD-10-CM | POA: Diagnosis not present

## 2020-05-06 ENCOUNTER — Encounter: Payer: Self-pay | Admitting: Internal Medicine

## 2020-05-09 DIAGNOSIS — Z1283 Encounter for screening for malignant neoplasm of skin: Secondary | ICD-10-CM | POA: Diagnosis not present

## 2020-05-09 DIAGNOSIS — L84 Corns and callosities: Secondary | ICD-10-CM | POA: Diagnosis not present

## 2020-05-09 DIAGNOSIS — D225 Melanocytic nevi of trunk: Secondary | ICD-10-CM | POA: Diagnosis not present

## 2020-07-22 ENCOUNTER — Ambulatory Visit (AMBULATORY_SURGERY_CENTER): Payer: Self-pay

## 2020-07-22 ENCOUNTER — Other Ambulatory Visit: Payer: Self-pay

## 2020-07-22 VITALS — Ht 64.0 in | Wt 166.0 lb

## 2020-07-22 DIAGNOSIS — Z800 Family history of malignant neoplasm of digestive organs: Secondary | ICD-10-CM

## 2020-07-22 DIAGNOSIS — K5120 Ulcerative (chronic) proctitis without complications: Secondary | ICD-10-CM

## 2020-07-22 NOTE — Progress Notes (Signed)
No egg or soy allergy known to patient  No issues with past sedation with any surgeries or procedures no intubation problems in the past  No FH of Malignant Hyperthermia No diet pills per patient No home 02 use per patient  No blood thinners per patient  Pt denies issues with constipation  No A fib or A flutter  EMMI video to pt or via MyChart  COVID 19 guidelines implemented in PV today with Pt and RN   Pt request during appt to not do SUtab due to difficulty swallowing pills, changed prep to Miralax prep  Pt concerned over switching date of procedure, reviewed alternative dates with pt and pt decided to stay with 8/24 colonoscopy.  Pt has been vaccinated for Covid.  Due to the COVID-19 pandemic we are asking patients to follow these guidelines. Please only bring one care partner. Please be aware that your care partner may wait in the car in the parking lot or if they feel like they will be too hot to wait in the car, they may wait in the lobby on the 4th floor. All care partners are required to wear a mask the entire time (we do not have any that we can provide them), they need to practice social distancing, and we will do a Covid check for all patient's and care partners when you arrive. Also we will check their temperature and your temperature. If the care partner waits in their car they need to stay in the parking lot the entire time and we will call them on their cell phone when the patient is ready for discharge so they can bring the car to the front of the building. Also all patient's will need to wear a mask into building.

## 2020-07-23 ENCOUNTER — Encounter: Payer: Self-pay | Admitting: Cardiology

## 2020-07-23 ENCOUNTER — Ambulatory Visit: Payer: Medicare HMO | Admitting: Cardiology

## 2020-07-23 VITALS — BP 140/80 | HR 67 | Ht 64.0 in | Wt 165.8 lb

## 2020-07-23 DIAGNOSIS — Z720 Tobacco use: Secondary | ICD-10-CM | POA: Diagnosis not present

## 2020-07-23 DIAGNOSIS — I10 Essential (primary) hypertension: Secondary | ICD-10-CM

## 2020-07-23 DIAGNOSIS — I471 Supraventricular tachycardia: Secondary | ICD-10-CM

## 2020-07-23 NOTE — Patient Instructions (Signed)
Medication Instructions:  ?The current medical regimen is effective;  continue present plan and medications. ? ?*If you need a refill on your cardiac medications before your next appointment, please call your pharmacy* ? ?Follow-Up: ?At [facility], you and your health needs are our priority.  As part of our continuing mission to provide you with exceptional heart care, we have created designated Provider Care Teams.  These Care Teams include your primary Cardiologist (physician) and Advanced Practice Providers (APPs -  Physician Assistants and Nurse Practitioners) who all work together to provide you with the care you need, when you need it. ? ?We recommend signing up for the patient portal called "MyChart".  Sign up information is provided on this After Visit Summary.  MyChart is used to connect with patients for Virtual Visits (Telemedicine).  Patients are able to view lab/test results, encounter notes, upcoming appointments, etc.  Non-urgent messages can be sent to your provider as well.   ?To learn more about what you can do with MyChart, go to [email or web address].   ? ?Your next appointment:   ?2 year(s) ? ?The format for your next appointment:   ?In Person ? ?Provider:   ?Mark Skains, MD   ? ? ?Thank you for choosing [facility]!! ? ? ? ?

## 2020-07-23 NOTE — Progress Notes (Signed)
[place]. [address] [place], [place]  [address] Phone: [phone] Fax:  [phone]  Date:  07/23/2020   ID:  Jennifer Sexton [birth year], MRN [id number]  PCP:  Lawerance Cruel, MD   History of Present Illness: Jennifer Sexton is a 69 y.o. female with hypertension, hypothyroidism, smoker with PSVT on Holter monitor 10/09 showed rare PACs, 6 of them, with no adverse arrhythmias.  She is on the diltiazem CD 180 mg once a day. At prior appt visit we stopped her metoprolol 50 mg once a day and started diltiazem as above. She felt as though she was having some fatigue or decreased energy levels on Bb. Energy level is improved off of beta blocker.  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 She smoked a few more during tax season.  No syncope, no bleeding. She did have stage I uterine cancer and hysterectomy. Overall reassuring prognosis. she saw OB/GYN and was given good results.   In regards to hypothyroidism, Dr. Harrington Challenger has is under good control.  She is now retired. No change from prior visit.  She was helping a friend who had chemotherapy. Overall doing well. No significant runs of palpitations. Minor skips here in there. No chest pain, no bleeding.  She did have an episode of an ulcerative colitis flare after she quit smoking for 17 days while helping her friend out. She try to resume smoking to see if this would get rid of the flare but it did not. She had to go on mesalamine which she has subsequently stopped she stated. At this point she does not feel as though it is doing much for her and she is worried about the cost and expense.  08/03/18 - low carbs. Bread, sugar. Tried intermittent fasting to 11-7.  Rare palpitations.   07/23/2020-here for the follow-up of palpitations, PSVT.  Overall doing quite well, difficulties.  No fevers chills nausea vomiting syncope bleeding.  Continues to walk approximately 2 miles a  day with a walking body.  Excellent.  No anginal symptoms.  Has gained back some of the weight that she had lost previously.  Enjoys snacks.  Wt Readings from Last 3 Encounters:  07/23/20 165 lb 12.8 oz (75.2 kg)  07/22/20 166 lb (75.3 kg)  08/03/18 151 lb 6.4 oz (68.7 kg)     Past Medical History:  Diagnosis Date  . Allergic rhinitis   . Anemia    hx of   . Arthritis   . Broken foot 2002   left  . Cancer ([place]) 2013   stage 1 A Uterine cancer- hysterectemy   . Chest pain   . Elevated LDL cholesterol level   . Hypertension   . Hypothyroidism   . Osteopenia 2011  . Paroxysmal supraventricular tachycardia ([place])   . Tobacco dependence   . Ulcerative proctitis [facility])     Past Surgical History:  Procedure Laterality Date  . CATARACT EXTRACTION     2008-right eye, 2010- left eye  . COLONOSCOPY  08/04/2015  . LYMPH NODE DISSECTION N/A 04/03/2013   Procedure: LYMPH NODE DISSECTION;  Surgeon: Janie Morning, MD;  Location: [facility];  Service: Gynecology;  Laterality: N/A;  . OOPHORECTOMY  2005  . ROBOTIC ASSISTED TOTAL HYSTERECTOMY WITH BILATERAL SALPINGO OOPHERECTOMY Left 04/03/2013   Procedure: ROBOTIC ASSISTED TOTAL HYSTERECTOMY WITH LEFT SALPINGO OOPHORECTOMY;  Surgeon: Janie Morning, MD;  Location: [facility];  Service: Gynecology;  Laterality: Left;  . ROOT CANAL  2013  . THYROIDECTOMY    . TONSILLECTOMY  1969  . TUBAL LIGATION  1987  . WISDOM TOOTH EXTRACTION  1969    Current Outpatient Medications  Medication Sig Dispense Refill  . Cholecalciferol (VITAMIN D3) 2000 UNITS TABS Take by mouth.    . diltiazem (DILT-XR) 180 MG 24 hr capsule Take 1 capsule (180 mg total) by mouth daily. 90 capsule 3  . levothyroxine (SYNTHROID, LEVOTHROID) 100 MCG tablet Take 50-100 mcg by mouth every morning. Takes 1/2 on Monday & Friday.     No current facility-administered medications for this visit.    Allergies:   No Known Allergies  Social History:  The patient  reports that she has been  smoking cigarettes. She has a 11.75 pack-year smoking history. She has never used smokeless tobacco. She reports current alcohol use of about 14.0 standard drinks of alcohol per week. She reports that she does not use drugs.   Family History  Problem Relation Age of Onset  . Cerebral aneurysm Mother   . Colon cancer Father   . Colon polyps Father   . Esophageal cancer Neg Hx   . Rectal cancer Neg Hx   . Stomach cancer Neg Hx     ROS:  Please see the history of present illness.   Denies any fevers, chills, orthopnea, PND  All other systems reviewed and negative.   PHYSICAL EXAM: VS:  BP 140/80   Pulse 67   Ht 5\' 4"  (1.626 m)   Wt 165 lb 12.8 oz (75.2 kg)   SpO2 97%   BMI 28.46 kg/m  GEN: Well nourished, well developed, in no acute distress  HEENT: normal  Neck: no JVD, carotid bruits, or masses Cardiac: RRR; no murmurs, rubs, or gallops,no edema  Respiratory:  clear to auscultation bilaterally, normal work of breathing GI: soft, nontender, nondistended, + BS MS: no deformity or atrophy  Skin: warm and dry, no rash Neuro:  Alert and Oriented x 3, Strength and sensation are intact Psych: euthymic mood, full affect   EKG:  Today, 07/23/2020-sinus rhythm 67 with no other abnormalities-prior 08/03/2018-sinus rhythm 90 with no other abnormalities personally viewed 08/11/16-sinus rhythm, 90, no other abnormalities personally viewed-prior 08/08/15-sinus rhythm, 69, no other abnormalities. Personally viewed 05/08/14-sinus rhythm, sinus arrhythmia, 83, poor R wave progression    ECHO: 09/16/08 -  Normal left ventricular size and function. Left ventricular ejection fraction estimated by 2D at 60-65 percent. Grade 1 diastolic dysfunction Normal right ventricular size and function. No significant valvular disease.   ASSESSMENT AND PLAN:  1. PSVT -overall doing well, has been maintaining sinus rhythm.  Has not noticed any palpitations.  Tolerating the diltiazem well.  2. Rare PAC's -very  rarely feeling any palpitations.  Continue with diltiazem. 3. Hypothyroidism-currently controlled, monitored. Can play a role in palpitations.  Dr. Harrington Challenger has been monitoring.  Recently altered Synthroid dose. 4. Tobacco use - she states that she smokes approximately one cigarette every other day and this keeps her ulcerative colitis in check.  Sometimes she smokes 0-2 a day.  When she stops, she bleeds. Still overall encourage tobacco cessation from a cardiovascular perspective.  No changes made. 5. She's been quite stable. Please let me know if she needs to be seen sooner.  We are going to do a 2-year follow-up again.  Signed, Candee Furbish, MD [facility]  07/23/2020 9:24 AM

## 2020-08-05 ENCOUNTER — Encounter: Payer: Self-pay | Admitting: Internal Medicine

## 2020-08-05 ENCOUNTER — Other Ambulatory Visit: Payer: Self-pay

## 2020-08-05 ENCOUNTER — Ambulatory Visit (AMBULATORY_SURGERY_CENTER): Payer: Medicare HMO | Admitting: Internal Medicine

## 2020-08-05 VITALS — BP 142/89 | HR 69 | Temp 97.1°F | Resp 20 | Ht 64.0 in | Wt 166.0 lb

## 2020-08-05 DIAGNOSIS — K635 Polyp of colon: Secondary | ICD-10-CM | POA: Diagnosis not present

## 2020-08-05 DIAGNOSIS — K5120 Ulcerative (chronic) proctitis without complications: Secondary | ICD-10-CM

## 2020-08-05 DIAGNOSIS — D123 Benign neoplasm of transverse colon: Secondary | ICD-10-CM

## 2020-08-05 DIAGNOSIS — K621 Rectal polyp: Secondary | ICD-10-CM

## 2020-08-05 DIAGNOSIS — Z800 Family history of malignant neoplasm of digestive organs: Secondary | ICD-10-CM | POA: Diagnosis not present

## 2020-08-05 DIAGNOSIS — D128 Benign neoplasm of rectum: Secondary | ICD-10-CM

## 2020-08-05 DIAGNOSIS — K5190 Ulcerative colitis, unspecified, without complications: Secondary | ICD-10-CM | POA: Diagnosis not present

## 2020-08-05 MED ORDER — SODIUM CHLORIDE 0.9 % IV SOLN: 500.0000 mL | Freq: Once | INTRAVENOUS | Status: DC

## 2020-08-05 NOTE — Progress Notes (Signed)
PT taken to PACU. Monitors in place. VSS. Report given to RN. 

## 2020-08-05 NOTE — Patient Instructions (Signed)
Handouts on polyps and diverticulosis given to you today  °Await pathology results  ° °YOU HAD AN ENDOSCOPIC PROCEDURE TODAY AT THE [facility]:   Refer to the procedure report that was given to you for any specific questions about what was found during the examination.  If the procedure report does not answer your questions, please call your gastroenterologist to clarify.  If you requested that your care partner not be given the details of your procedure findings, then the procedure report has been included in a sealed envelope for you to review at your convenience later. ° °YOU SHOULD EXPECT: Some feelings of bloating in the abdomen. Passage of more gas than usual.  Walking can help get rid of the air that was put into your GI tract during the procedure and reduce the bloating. If you had a lower endoscopy (such as a colonoscopy or flexible sigmoidoscopy) you may notice spotting of blood in your stool or on the toilet paper. If you underwent a bowel prep for your procedure, you may not have a normal bowel movement for a few days. ° °Please Note:  You might notice some irritation and congestion in your nose or some drainage.  This is from the oxygen used during your procedure.  There is no need for concern and it should clear up in a day or so. ° °SYMPTOMS TO REPORT IMMEDIATELY: ° °Following lower endoscopy (colonoscopy or flexible sigmoidoscopy): ° Excessive amounts of blood in the stool ° Significant tenderness or worsening of abdominal pains ° Swelling of the abdomen that is new, acute ° Fever of 100°F or higher ° °For urgent or emergent issues, a gastroenterologist can be reached at any hour by calling [phone]. °Do not use MyChart messaging for urgent concerns.  ° ° °DIET:  We do recommend a small meal at first, but then you may proceed to your regular diet.  Drink plenty of fluids but you should avoid alcoholic beverages for 24 hours. ° °ACTIVITY:  You should plan to take it easy for the  rest of today and you should NOT DRIVE or use heavy machinery until tomorrow (because of the sedation medicines used during the test).   ° °FOLLOW UP: °Our staff will call the number listed on your records 48-72 hours following your procedure to check on you and address any questions or concerns that you may have regarding the information given to you following your procedure. If we do not reach you, we will leave a message.  We will attempt to reach you two times.  During this call, we will ask if you have developed any symptoms of COVID 19. If you develop any symptoms (ie: fever, flu-like symptoms, shortness of breath, cough etc.) before then, please call [phone].  If you test positive for Covid 19 in the 2 weeks post procedure, please call and report this information to us.   ° °If any biopsies were taken you will be contacted by phone or by letter within the next 1-3 weeks.  Please call us at [phone] if you have not heard about the biopsies in 3 weeks.  ° ° °SIGNATURES/CONFIDENTIALITY: °You and/or your care partner have signed paperwork which will be entered into your electronic medical record.  These signatures attest to the fact that that the information above on your After Visit Summary has been reviewed and is understood.  Full responsibility of the confidentiality of this discharge information lies with you and/or your care-partner.  °

## 2020-08-05 NOTE — Progress Notes (Signed)
Called to room to assist during endoscopic procedure.  Patient ID and intended procedure confirmed with present staff. Received instructions for my participation in the procedure from the performing physician.  

## 2020-08-05 NOTE — Op Note (Signed)
[facility] Patient Name: Jennifer Sexton Procedure Date: 08/05/2020 1:32 PM MRN: [id number] Endoscopist: Jerene Bears , MD Age: 69 Referring MD:  Date of Birth: [birth year] Gender: Female Account #: [id number] Procedure:                Colonoscopy Indications:              Screening in patient at increased risk: Family                            history of 1st-degree relative with colorectal                            cancer, Last colonoscopy 5 years ago, history of                            ulcerative proctitis Medicines:                Monitored Anesthesia Care Procedure:                Pre-Anesthesia Assessment:                           - Prior to the procedure, a History and Physical                            was performed, and patient medications and                            allergies were reviewed. The patient's tolerance of                            previous anesthesia was also reviewed. The risks                            and benefits of the procedure and the sedation                            options and risks were discussed with the patient.                            All questions were answered, and informed consent                            was obtained. Prior Anticoagulants: The patient has                            taken no previous anticoagulant or antiplatelet                            agents. ASA Grade Assessment: II - A patient with                            mild systemic disease. After reviewing the risks  and benefits, the patient was deemed in                            satisfactory condition to undergo the procedure.                           After obtaining informed consent, the colonoscope                            was passed under direct vision. Throughout the                            procedure, the patient's blood pressure, pulse, and                            oxygen saturations were monitored continuously.  The                            Colonoscope was introduced through the anus and                            advanced to the cecum, identified by appendiceal                            orifice and ileocecal valve. The colonoscopy was                            somewhat difficult due to a tortuous colon. The                            patient tolerated the procedure well. The quality                            of the bowel preparation was good. The ileocecal                            valve, appendiceal orifice, and rectum were                            photographed. The bowel preparation used was                            Miralax via split dose instruction. Scope In: 1:40:39 PM Scope Out: 2:01:40 PM Scope Withdrawal Time: 0 hours 13 minutes 4 seconds  Total Procedure Duration: 0 hours 21 minutes 1 second  Findings:                 The digital rectal exam was normal.                           Two sessile polyps were found in the transverse                            colon. The polyps were 6 to 8 mm in size.  These                            polyps were removed with a cold snare. Resection                            and retrieval were complete.                           A 6 mm polyp was found in the rectum. The polyp was                            sessile. The polyp was removed with a cold snare.                            Resection and retrieval were complete.                           A few small-mouthed diverticula were found in the                            sigmoid colon.                           Internal hemorrhoids were found during                            retroflexion. The hemorrhoids were medium-sized.                           The exam was otherwise without abnormality. No                            evidence of active colitis/proctitis. Complications:            No immediate complications. Estimated Blood Loss:     Estimated blood loss was minimal. Impression:               -  Two 6 to 8 mm polyps in the transverse colon,                            removed with a cold snare. Resected and retrieved.                           - One 6 mm polyp in the rectum, removed with a cold                            snare. Resected and retrieved.                           - Diverticulosis in the sigmoid colon.                           - Internal hemorrhoids. Recommendation:           - Patient has a contact number available  for                            emergencies. The signs and symptoms of potential                            delayed complications were discussed with the                            patient. Return to normal activities tomorrow.                            Written discharge instructions were provided to the                            patient.                           - Resume previous diet.                           - Continue present medications.                           - Await pathology results.                           - Repeat colonoscopy is recommended. The                            colonoscopy date will be determined after pathology                            results from today's exam become available for                            review. Jerene Bears, MD 08/05/2020 2:11:59 PM This report has been signed electronically.

## 2020-08-05 NOTE — Progress Notes (Signed)
Pt's states no medical or surgical changes since previsit or office visit.  VS CW  

## 2020-08-07 ENCOUNTER — Telehealth: Payer: Self-pay | Admitting: *Deleted

## 2020-08-07 ENCOUNTER — Telehealth: Payer: Self-pay

## 2020-08-07 NOTE — Telephone Encounter (Signed)
  Follow up Call-  Call back number 08/05/2020  Post procedure Call Back phone  # [phone]  Permission to leave phone message Yes  Some recent data might be hidden     Patient questions:  Do you have a fever, pain , or abdominal swelling? No. Pain Score  0 *  Have you tolerated food without any problems? Yes.    Have you been able to return to your normal activities? Yes.    Do you have any questions about your discharge instructions: Diet   No. Medications  No. Follow up visit  No.  Do you have questions or concerns about your Care? No.  Actions: * If pain score is 4 or above: No action needed, pain <4.  1. Have you developed a fever since your procedure? no  2.   Have you had an respiratory symptoms (SOB or cough) since your procedure? no  3.   Have you tested positive for COVID 19 since your procedure no  4.   Have you had any family members/close contacts diagnosed with the COVID 19 since your procedure?  no   If yes to any of these questions please route to Joylene John, RN and Joella Prince, RN

## 2020-08-07 NOTE — Telephone Encounter (Signed)
  Follow up Call-  Call back number 08/05/2020  Post procedure Call Back phone  # [phone]  Permission to leave phone message Yes  Some recent data might be hidden     First attempt for follow up phone call. No answer at number given.  Left message on voicemail.

## 2020-08-11 ENCOUNTER — Encounter: Payer: Self-pay | Admitting: Internal Medicine

## 2021-02-27 DIAGNOSIS — H5212 Myopia, left eye: Secondary | ICD-10-CM | POA: Diagnosis not present

## 2021-04-27 ENCOUNTER — Other Ambulatory Visit: Payer: Self-pay | Admitting: Family Medicine

## 2021-04-27 DIAGNOSIS — Z1231 Encounter for screening mammogram for malignant neoplasm of breast: Secondary | ICD-10-CM

## 2021-05-07 ENCOUNTER — Other Ambulatory Visit: Payer: Self-pay

## 2021-05-07 ENCOUNTER — Ambulatory Visit
Admission: RE | Admit: 2021-05-07 | Discharge: 2021-05-07 | Disposition: A | Discharge status: Home / Self Care | Payer: Medicare HMO | Source: Ambulatory Visit

## 2021-05-07 DIAGNOSIS — Z1231 Encounter for screening mammogram for malignant neoplasm of breast: Secondary | ICD-10-CM

## 2021-05-18 DIAGNOSIS — Z1389 Encounter for screening for other disorder: Secondary | ICD-10-CM | POA: Diagnosis not present

## 2021-05-18 DIAGNOSIS — Z0000 Encounter for general adult medical examination without abnormal findings: Secondary | ICD-10-CM | POA: Diagnosis not present

## 2021-05-19 DIAGNOSIS — Z131 Encounter for screening for diabetes mellitus: Secondary | ICD-10-CM | POA: Diagnosis not present

## 2021-05-19 DIAGNOSIS — E039 Hypothyroidism, unspecified: Secondary | ICD-10-CM | POA: Diagnosis not present

## 2021-05-19 DIAGNOSIS — I471 Supraventricular tachycardia: Secondary | ICD-10-CM | POA: Diagnosis not present

## 2021-05-19 DIAGNOSIS — Z79899 Other long term (current) drug therapy: Secondary | ICD-10-CM | POA: Diagnosis not present

## 2021-05-19 DIAGNOSIS — Z0000 Encounter for general adult medical examination without abnormal findings: Secondary | ICD-10-CM | POA: Diagnosis not present

## 2021-05-19 DIAGNOSIS — E7800 Pure hypercholesterolemia, unspecified: Secondary | ICD-10-CM | POA: Diagnosis not present

## 2021-05-19 DIAGNOSIS — M8580 Other specified disorders of bone density and structure, unspecified site: Secondary | ICD-10-CM | POA: Diagnosis not present

## 2022-02-22 ENCOUNTER — Other Ambulatory Visit: Payer: Self-pay | Admitting: Family Medicine

## 2022-02-22 DIAGNOSIS — Z1231 Encounter for screening mammogram for malignant neoplasm of breast: Secondary | ICD-10-CM

## 2022-03-08 DIAGNOSIS — H524 Presbyopia: Secondary | ICD-10-CM | POA: Diagnosis not present

## 2022-03-08 DIAGNOSIS — H5212 Myopia, left eye: Secondary | ICD-10-CM | POA: Diagnosis not present

## 2022-03-08 DIAGNOSIS — Z961 Presence of intraocular lens: Secondary | ICD-10-CM | POA: Diagnosis not present

## 2022-05-17 ENCOUNTER — Ambulatory Visit
Admission: RE | Admit: 2022-05-17 | Discharge: 2022-05-17 | Disposition: A | Discharge status: Home / Self Care | Payer: Medicare HMO | Source: Ambulatory Visit | Attending: Family Medicine | Admitting: Family Medicine

## 2022-05-17 DIAGNOSIS — Z1231 Encounter for screening mammogram for malignant neoplasm of breast: Secondary | ICD-10-CM | POA: Diagnosis not present

## 2022-05-24 DIAGNOSIS — Z79899 Other long term (current) drug therapy: Secondary | ICD-10-CM | POA: Diagnosis not present

## 2022-05-24 DIAGNOSIS — Z0000 Encounter for general adult medical examination without abnormal findings: Secondary | ICD-10-CM | POA: Diagnosis not present

## 2022-05-24 DIAGNOSIS — E039 Hypothyroidism, unspecified: Secondary | ICD-10-CM | POA: Diagnosis not present

## 2022-05-24 DIAGNOSIS — E7800 Pure hypercholesterolemia, unspecified: Secondary | ICD-10-CM | POA: Diagnosis not present

## 2022-06-02 DIAGNOSIS — E039 Hypothyroidism, unspecified: Secondary | ICD-10-CM | POA: Diagnosis not present

## 2022-06-02 DIAGNOSIS — I471 Supraventricular tachycardia: Secondary | ICD-10-CM | POA: Diagnosis not present

## 2022-06-02 DIAGNOSIS — Z0000 Encounter for general adult medical examination without abnormal findings: Secondary | ICD-10-CM | POA: Diagnosis not present

## 2022-06-02 DIAGNOSIS — E7800 Pure hypercholesterolemia, unspecified: Secondary | ICD-10-CM | POA: Diagnosis not present

## 2022-07-27 ENCOUNTER — Ambulatory Visit: Payer: Medicare HMO | Admitting: Cardiology

## 2022-07-27 ENCOUNTER — Encounter: Payer: Self-pay | Admitting: Cardiology

## 2022-07-27 VITALS — BP 140/70 | HR 85 | Ht 64.0 in | Wt 163.0 lb

## 2022-07-27 DIAGNOSIS — E785 Hyperlipidemia, unspecified: Secondary | ICD-10-CM

## 2022-07-27 DIAGNOSIS — Z8249 Family history of ischemic heart disease and other diseases of the circulatory system: Secondary | ICD-10-CM

## 2022-07-27 NOTE — Progress Notes (Signed)
Cardiology Office Note:    Date:  07/27/2022   ID:  [name], Sexton 1951-08-16, MRN [id number]  PCP:  Lawerance Cruel, MD   [place] [facility] Cardiologist:  Candee Furbish, MD     Referring MD: Lawerance Cruel, MD    History of Present Illness:    Jennifer Sexton is a 71 y.o. female here for the follow-up of PSVT. hypertension, hypothyroidism, smoker with PSVT on Holter monitor 10/09 showed rare PACs, 6 of them, with no adverse arrhythmias.  She is on the diltiazem CD 180 mg once a day. At prior appt visit we stopped her metoprolol 50 mg once a day and started diltiazem as above. She felt as though she was having some fatigue or decreased energy levels on Bb. Energy level is improved off of beta blocker.  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 She smoked a few more during tax season.  No syncope, no bleeding. She did have stage I uterine cancer and hysterectomy. Overall reassuring prognosis. she saw OB/GYN and was given good results.   In regards to hypothyroidism, Dr. Harrington Challenger has is under good control.   She is now retired. No change from prior visit.  In Jan 2022 - HR >100 for 6 hours. Went back down.  Her friend, Jennifer Sexton is also a patient of mine.  She has been helping several friends with transportation to colonoscopies for instance.  Past Medical History:  Diagnosis Date   Allergic rhinitis    Anemia    hx of    Arthritis    Broken foot 2002   left   Cancer ([place]) 2013   stage 1 A Uterine cancer- hysterectemy    Chest pain    Elevated LDL cholesterol level    Hypertension    Hypothyroidism    Osteopenia 2011   Paroxysmal supraventricular tachycardia ([place])    Tobacco dependence    Ulcerative proctitis [facility])     Past Surgical History:  Procedure Laterality Date   CATARACT EXTRACTION     2008-right eye, 2010- left eye   COLONOSCOPY  08/04/2015   LYMPH NODE DISSECTION N/A 04/03/2013    Procedure: LYMPH NODE DISSECTION;  Surgeon: Janie Morning, MD;  Location: [facility];  Service: Gynecology;  Laterality: N/A;   OOPHORECTOMY  2005   ROBOTIC ASSISTED TOTAL HYSTERECTOMY WITH BILATERAL SALPINGO OOPHERECTOMY Left 04/03/2013   Procedure: ROBOTIC ASSISTED TOTAL HYSTERECTOMY WITH LEFT SALPINGO OOPHORECTOMY;  Surgeon: Janie Morning, MD;  Location: [facility];  Service: Gynecology;  Laterality: Left;   ROOT CANAL  2013   THYROIDECTOMY     TONSILLECTOMY  1969   TUBAL LIGATION  1987   WISDOM TOOTH EXTRACTION  1969    Current Medications: Current Meds  Medication Sig   Cholecalciferol (VITAMIN D3) 2000 UNITS TABS Take by mouth.   diltiazem (DILT-XR) 180 MG 24 hr capsule Take 1 capsule (180 mg total) by mouth daily.   levothyroxine (SYNTHROID, LEVOTHROID) 100 MCG tablet Take 50-100 mcg by mouth every morning. Takes 1/2 on Monday & Friday.     Allergies:   Patient has no known allergies.   Social History   Socioeconomic History   Marital status: Married    Spouse name: Not on file   Number of children: Not on file   Years of education: Not on file   Highest education level: Not on file  Occupational History   Not on file  Tobacco Use  Smoking status: Some Days    Packs/day: 0.25    Years: 47.00    Total pack years: 11.75    Types: Cigarettes   Smokeless tobacco: Never   Tobacco comments:    info given, 0.5 pack a week  Vaping Use   Vaping Use: Never used  Substance and Sexual Activity   Alcohol use: Yes    Alcohol/week: 14.0 standard drinks of alcohol    Types: 14 Glasses of wine per week   Drug use: No   Sexual activity: Not on file  Other Topics Concern   Not on file  Social History Narrative   Not on file   Social Determinants of Health   Financial Resource Strain: Not on file  Food Insecurity: Not on file  Transportation Needs: Not on file  Physical Activity: Not on file  Stress: Not on file  Social Connections: Not on file     Family History: The  patient's family history includes Cerebral aneurysm in her mother; Colon cancer in her father; Colon polyps in her father. There is no history of Esophageal cancer, Rectal cancer, or Stomach cancer.  ROS:   Please see the history of present illness.     All other systems reviewed and are negative.  EKGs/Labs/Other Studies Reviewed:    The following studies were reviewed today: ECHO: 09/16/08 -  Normal left ventricular size and function. Left ventricular ejection fraction estimated by 2D at 60-65 percent. Grade 1 diastolic dysfunction Normal right ventricular size and function. No significant valvular disease.  EKG:  EKG is  ordered today.  The ekg ordered today demonstrates sinus rhythm 85 no other abnormalities  Recent Labs: No results found for requested labs within last 365 days.  Recent Lipid Panel No results found for: "CHOL", "TRIG", "HDL", "CHOLHDL", "VLDL", "LDLCALC", "LDLDIRECT"   Risk Assessment/Calculations:              Physical Exam:    VS:  BP (!) 140/70 (BP Location: Left Arm, Patient Position: Sitting, Cuff Size: Normal)   Pulse 85   Ht '5\' 4"'$  (1.626 m)   Wt 163 lb (73.9 kg)   SpO2 97%   BMI 27.98 kg/m     Wt Readings from Last 3 Encounters:  07/27/22 163 lb (73.9 kg)  08/05/20 166 lb (75.3 kg)  07/23/20 165 lb 12.8 oz (75.2 kg)     GEN:  Well nourished, well developed in no acute distress HEENT: Normal NECK: No JVD; No carotid bruits LYMPHATICS: No lymphadenopathy CARDIAC: RRR, no murmurs, no rubs, gallops RESPIRATORY:  Clear to auscultation without rales, wheezing or rhonchi  ABDOMEN: Soft, non-tender, non-distended MUSCULOSKELETAL:  No edema; No deformity  SKIN: Warm and dry NEUROLOGIC:  Alert and oriented x 3 PSYCHIATRIC:  Normal affect   ASSESSMENT:    1. Hyperlipidemia, unspecified hyperlipidemia type   2. Family history of coronary artery disease    PLAN:    In order of problems listed above:  PSVT -overall doing well, has been  maintaining sinus rhythm.  Had 1 episode in 2022 with heart rate greater than 100 for about 6 hours.  Spontaneously resolved.  Tolerating the diltiazem SR 180 mg well.  She knows that she can take an additional diltiazem if necessary. Rare PAC's -very rarely feeling any palpitations.  Continue with the diltiazem. Hypothyroidism-currently controlled, monitored.  Levothyroxine 100 mcg she takes 50-100 every day one half on Monday and Friday. can play a role in palpitations.  Dr. Harrington Challenger has been monitoring.  Recently altered Synthroid dose.  TSH 1.2 Tobacco use - she states that she smokes approximately one cigarette every other day and this keeps her ulcerative colitis in check.  Sometimes she smokes 0-2 a day.  When she stops, she bleeds. Still overall encourage tobacco cessation from a cardiovascular perspective.  No changes made. Hyperlipidemia - LDL 158. No strong Cardiac history. Mother had cerebral aneurysm.  Dr. Harrington Challenger mentioned possibility of calcium score.  I think this is a great idea for her.  We will order the test, $99. She's been quite stable. Please let me know if she needs to be seen sooner.  We are going to do a 2-year follow-up again.        Medication Adjustments/Labs and Tests Ordered: Current medicines are reviewed at length with the patient today.  Concerns regarding medicines are outlined above.  Orders Placed This Encounter  Procedures   CT CARDIAC SCORING (SELF PAY ONLY)   EKG 12-Lead   No orders of the defined types were placed in this encounter.   Patient Instructions  Medication Instructions:  The current medical regimen is effective;  continue present plan and medications.  *If you need a refill on your cardiac medications before your next appointment, please call your pharmacy*   Testing/Procedures: Your physician has requested that you have a Coronary Calcium score which is completed by CT. Cardiac computed tomography (CT) is a painless test that uses an x-ray  machine to take clear, detailed pictures of your heart. There are no instructions for this testing.  You may eat/drink and take your normal medications this day.  The cost of the testing is $99 due at the time of your appointment.   Follow-Up: At [facility], you and your health needs are our priority.  As part of our continuing mission to provide you with exceptional heart care, we have created designated Provider Care Teams.  These Care Teams include your primary Cardiologist (physician) and Advanced Practice Providers (APPs -  Physician Assistants and Nurse Practitioners) who all work together to provide you with the care you need, when you need it.  We recommend signing up for the patient portal called "MyChart".  Sign up information is provided on this After Visit Summary.  MyChart is used to connect with patients for Virtual Visits (Telemedicine).  Patients are able to view lab/test results, encounter notes, upcoming appointments, etc.  Non-urgent messages can be sent to your provider as well.   To learn more about what you can do with MyChart, go to [email or web address].    Your next appointment:   2 year(s)  The format for your next appointment:   In Person  Provider:   Candee Furbish, MD {   Important Information About Sugar         Signed, Candee Furbish, MD  07/27/2022 10:32 AM    [place]

## 2022-07-27 NOTE — Patient Instructions (Signed)
Medication Instructions:  The current medical regimen is effective;  continue present plan and medications.  *If you need a refill on your cardiac medications before your next appointment, please call your pharmacy*   Testing/Procedures: Your physician has requested that you have a Coronary Calcium score which is completed by CT. Cardiac computed tomography (CT) is a painless test that uses an x-ray machine to take clear, detailed pictures of your heart. There are no instructions for this testing.  You may eat/drink and take your normal medications this day.  The cost of the testing is $99 due at the time of your appointment.   Follow-Up: At [facility], you and your health needs are our priority.  As part of our continuing mission to provide you with exceptional heart care, we have created designated Provider Care Teams.  These Care Teams include your primary Cardiologist (physician) and Advanced Practice Providers (APPs -  Physician Assistants and Nurse Practitioners) who all work together to provide you with the care you need, when you need it.  We recommend signing up for the patient portal called "MyChart".  Sign up information is provided on this After Visit Summary.  MyChart is used to connect with patients for Virtual Visits (Telemedicine).  Patients are able to view lab/test results, encounter notes, upcoming appointments, etc.  Non-urgent messages can be sent to your provider as well.   To learn more about what you can do with MyChart, go to [email or web address].    Your next appointment:   2 year(s)  The format for your next appointment:   In Person  Provider:   Candee Furbish, MD {   Important Information About Sugar

## 2022-08-09 ENCOUNTER — Encounter (HOSPITAL_BASED_OUTPATIENT_CLINIC_OR_DEPARTMENT_OTHER): Payer: Self-pay

## 2022-08-09 ENCOUNTER — Ambulatory Visit (HOSPITAL_BASED_OUTPATIENT_CLINIC_OR_DEPARTMENT_OTHER)
Admission: RE | Admit: 2022-08-09 | Discharge: 2022-08-09 | Disposition: A | Discharge status: Home / Self Care | Payer: Medicare HMO | Source: Ambulatory Visit | Attending: Cardiology | Admitting: Cardiology

## 2022-08-09 DIAGNOSIS — E785 Hyperlipidemia, unspecified: Secondary | ICD-10-CM | POA: Insufficient documentation

## 2022-08-09 DIAGNOSIS — Z8249 Family history of ischemic heart disease and other diseases of the circulatory system: Secondary | ICD-10-CM | POA: Insufficient documentation

## 2022-08-12 ENCOUNTER — Other Ambulatory Visit: Payer: Self-pay | Admitting: *Deleted

## 2022-08-12 DIAGNOSIS — E785 Hyperlipidemia, unspecified: Secondary | ICD-10-CM

## 2022-08-12 DIAGNOSIS — Z79899 Other long term (current) drug therapy: Secondary | ICD-10-CM

## 2022-08-12 MED ORDER — ROSUVASTATIN CALCIUM 10 MG PO TABS: 10.0000 mg | ORAL_TABLET | Freq: Every day | ORAL | 3 refills | Status: DC

## 2022-10-21 IMAGING — MG MM DIGITAL SCREENING BILAT W/ TOMO AND CAD
8 series · 8 of 24 positions shown · non-contrast
Comparison: Previous exam(s).

CLINICAL DATA: Screening.

EXAM:
DIGITAL SCREENING BILATERAL MAMMOGRAM WITH TOMOSYNTHESIS AND CAD
TECHNIQUE: Bilateral screening digital craniocaudal and mediolateral oblique
mammograms were obtained. Bilateral screening digital breast
tomosynthesis was performed. The images were evaluated with
computer-aided detection.

[R CC synth-2D]
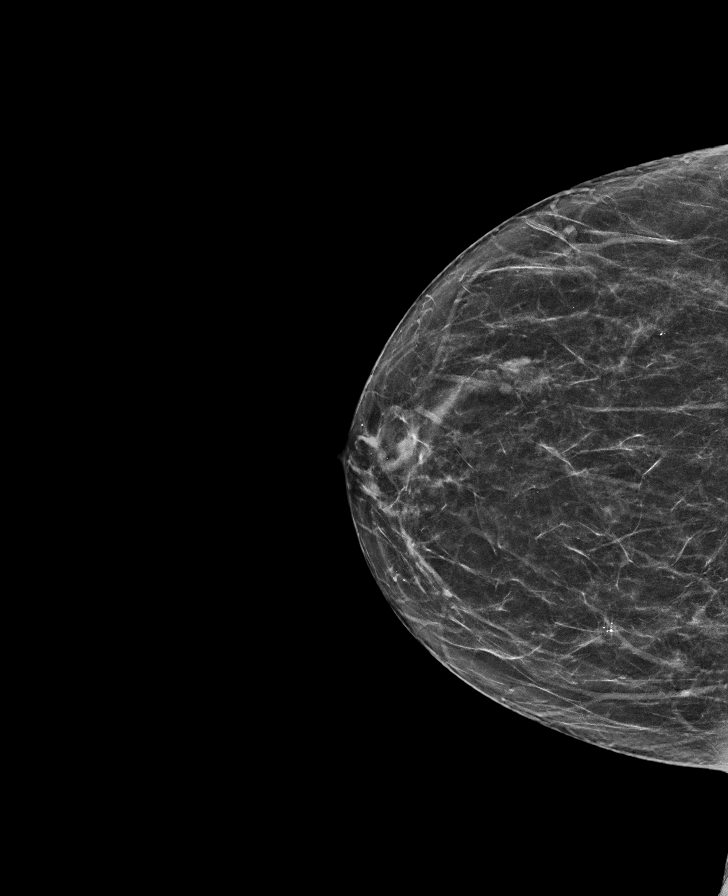

[L MLO synth-2D]
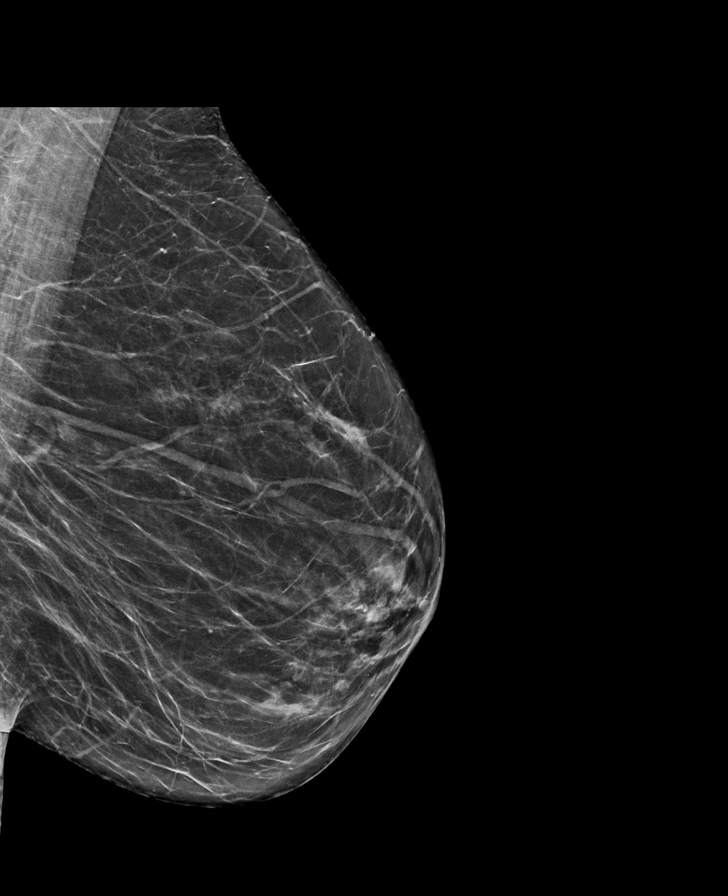

[L CC synth-2D]
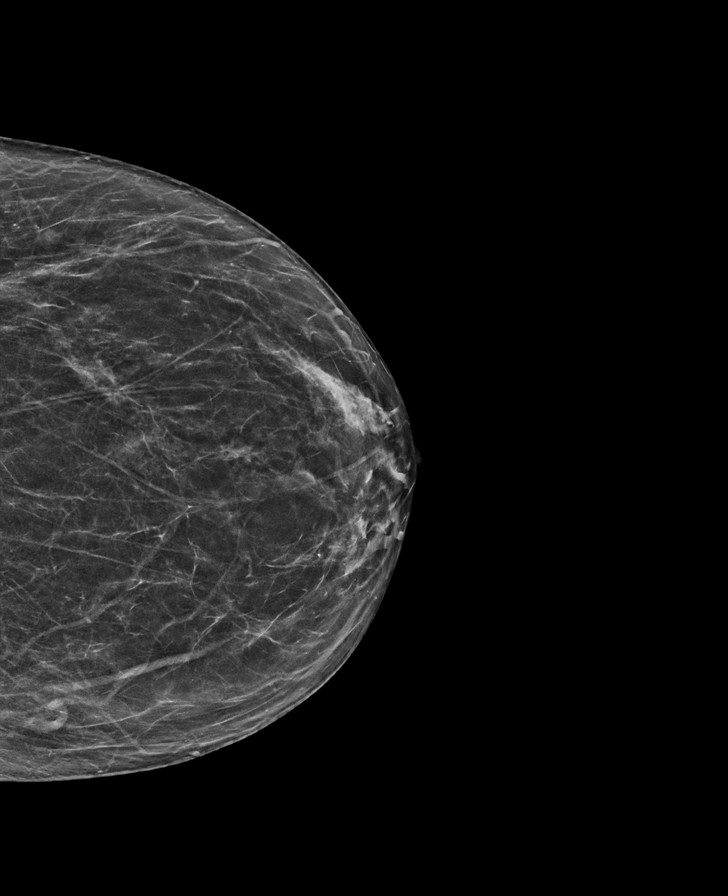

[R MLO synth-2D]
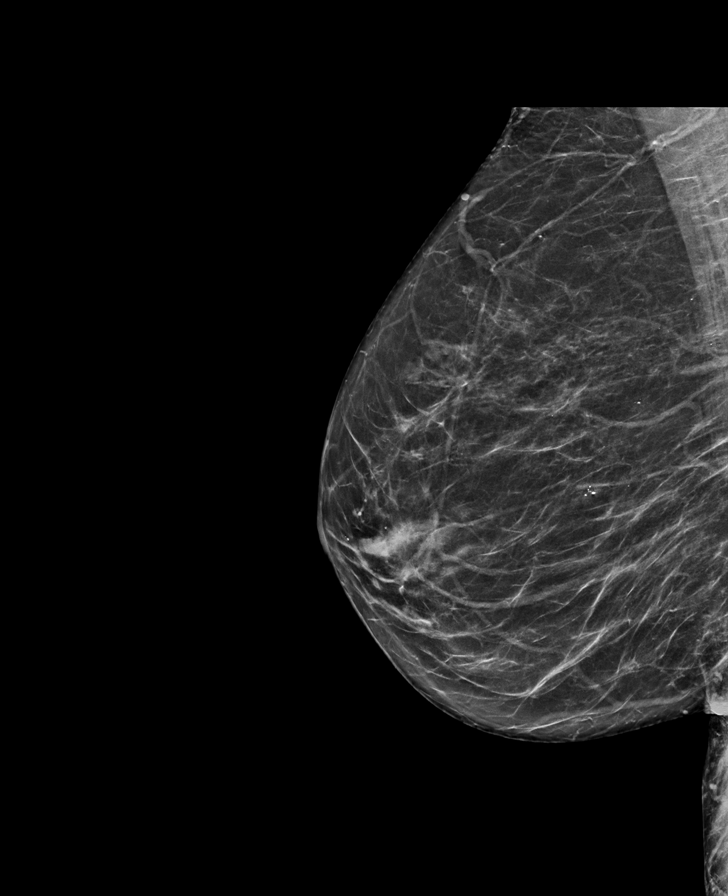

[R MLO tomo · tomo slice 33/64.0]
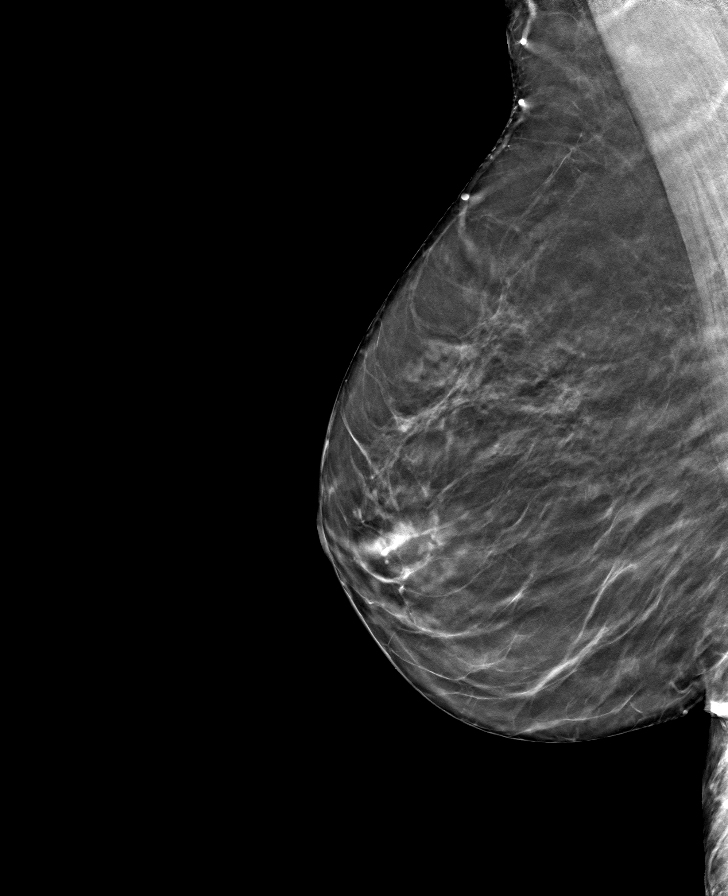

[L CC tomo · tomo slice 32/63.0]
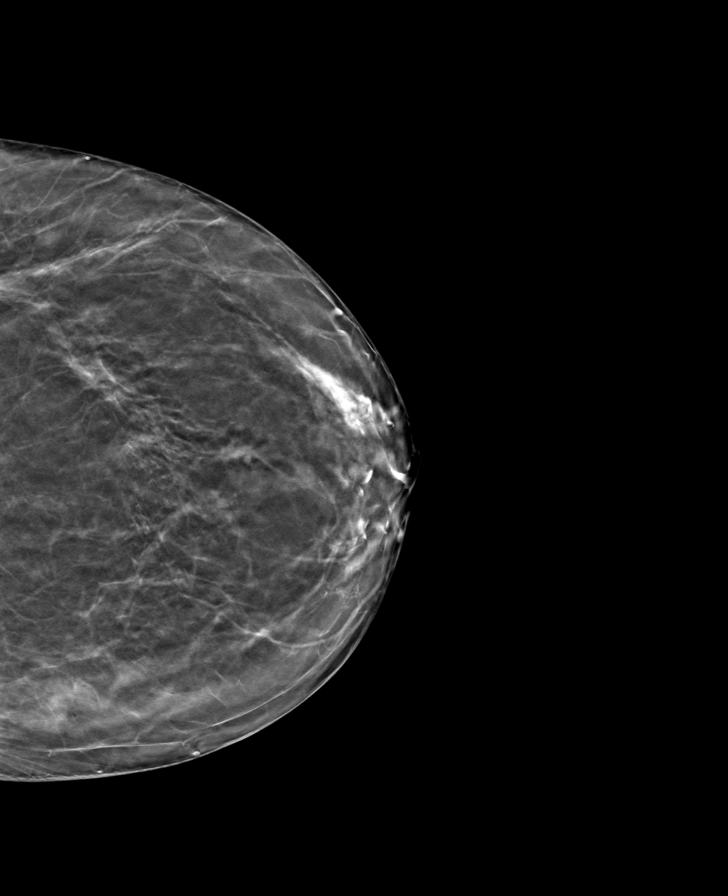

[R CC tomo · tomo slice 32/63.0]
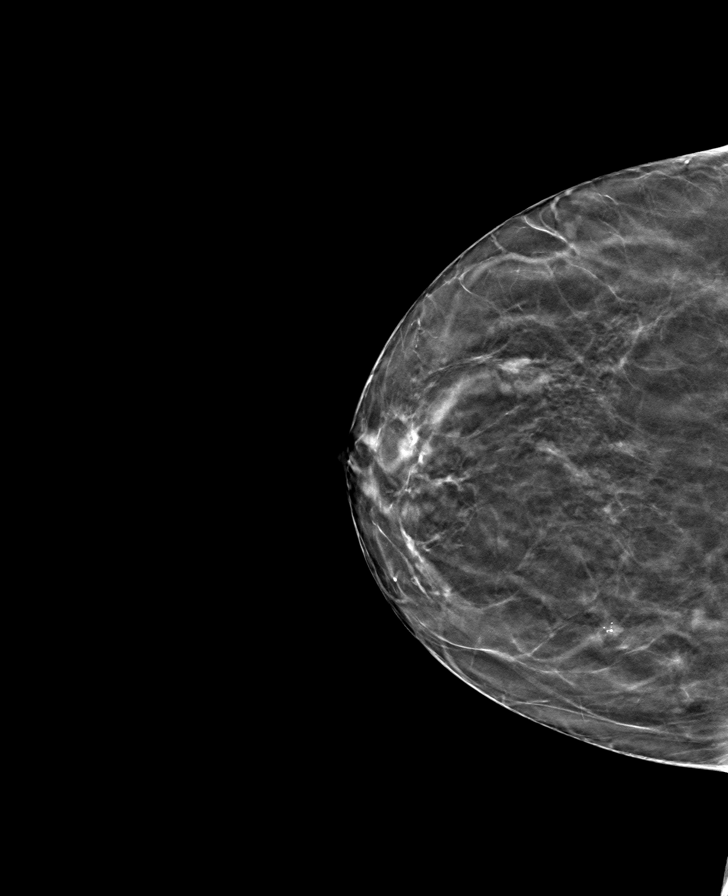

[L MLO tomo · tomo slice 33/66.0]
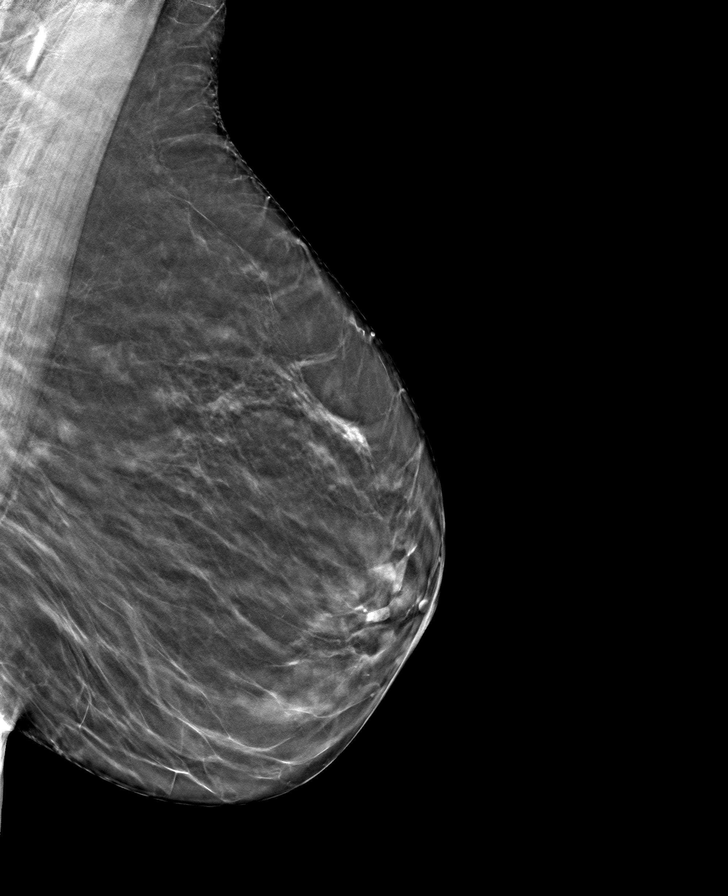

[8 of 24 positions shown; findings below may reference images not displayed]

ACR Breast Density Category b: There are scattered areas of
fibroglandular density.
FINDINGS: There are no findings suspicious for malignancy.
IMPRESSION: No mammographic evidence of malignancy. A result letter of this
screening mammogram will be mailed directly to the patient.

RECOMMENDATION:
Screening mammogram in one year. (Code:51-O-LD2)

BI-RADS CATEGORY  1: Negative.

## 2022-10-26 ENCOUNTER — Ambulatory Visit: Payer: Medicare HMO | Attending: Cardiology

## 2022-10-26 DIAGNOSIS — E785 Hyperlipidemia, unspecified: Secondary | ICD-10-CM

## 2022-10-26 DIAGNOSIS — Z79899 Other long term (current) drug therapy: Secondary | ICD-10-CM | POA: Diagnosis not present

## 2022-10-26 LAB — LIPID PANEL
Chol/HDL Ratio: 2.1 ratio (ref 0.0–4.4)
Cholesterol, Total: 178 mg/dL (ref 100–199)
HDL: 83 mg/dL (ref >39)
LDL Chol Calc (NIH): 83 mg/dL (ref 0–99)
Triglycerides: 60 mg/dL (ref 0–149)
VLDL Cholesterol Cal: 12 mg/dL (ref 5–40)

## 2022-10-26 LAB — ALT: ALT: 12 IU/L (ref 0–32)

## 2022-10-27 ENCOUNTER — Telehealth: Payer: Self-pay | Admitting: *Deleted

## 2022-10-27 DIAGNOSIS — Z79899 Other long term (current) drug therapy: Secondary | ICD-10-CM

## 2022-10-27 DIAGNOSIS — E785 Hyperlipidemia, unspecified: Secondary | ICD-10-CM

## 2022-10-27 NOTE — Telephone Encounter (Signed)
Spoke with pt who is aware of results and to increase to 20 mg daily.  Pt states she is unsure if she will be able to tolerate taking 20 mg a day and doesn't want/need a new rx sent to her pharmacy at this time.   2 months lipid panel ordered and scheduled 12/27/2022.  Pt will call back if unable to tolerate the increase dose of Crestor.

## 2022-12-27 ENCOUNTER — Ambulatory Visit: Payer: Medicare HMO | Attending: Cardiology

## 2022-12-27 DIAGNOSIS — E785 Hyperlipidemia, unspecified: Secondary | ICD-10-CM

## 2022-12-27 DIAGNOSIS — Z79899 Other long term (current) drug therapy: Secondary | ICD-10-CM

## 2022-12-28 LAB — LIPID PANEL
Chol/HDL Ratio: 1.6 ratio (ref 0.0–4.4)
Cholesterol, Total: 183 mg/dL (ref 100–199)
HDL: 115 mg/dL (ref >39)
LDL Chol Calc (NIH): 58 mg/dL (ref 0–99)
Triglycerides: 49 mg/dL (ref 0–149)
VLDL Cholesterol Cal: 10 mg/dL (ref 5–40)

## 2023-03-14 DIAGNOSIS — Z961 Presence of intraocular lens: Secondary | ICD-10-CM | POA: Diagnosis not present

## 2023-03-14 DIAGNOSIS — H5212 Myopia, left eye: Secondary | ICD-10-CM | POA: Diagnosis not present

## 2023-05-30 ENCOUNTER — Other Ambulatory Visit: Payer: Self-pay | Admitting: Family Medicine

## 2023-05-30 DIAGNOSIS — Z1231 Encounter for screening mammogram for malignant neoplasm of breast: Secondary | ICD-10-CM

## 2023-05-30 DIAGNOSIS — E7800 Pure hypercholesterolemia, unspecified: Secondary | ICD-10-CM | POA: Diagnosis not present

## 2023-05-30 DIAGNOSIS — E663 Overweight: Secondary | ICD-10-CM | POA: Diagnosis not present

## 2023-05-30 DIAGNOSIS — E039 Hypothyroidism, unspecified: Secondary | ICD-10-CM | POA: Diagnosis not present

## 2023-05-30 DIAGNOSIS — Z0000 Encounter for general adult medical examination without abnormal findings: Secondary | ICD-10-CM | POA: Diagnosis not present

## 2023-05-30 DIAGNOSIS — Z9181 History of falling: Secondary | ICD-10-CM | POA: Diagnosis not present

## 2023-05-30 DIAGNOSIS — I471 Supraventricular tachycardia, unspecified: Secondary | ICD-10-CM | POA: Diagnosis not present

## 2023-06-01 DIAGNOSIS — Z9181 History of falling: Secondary | ICD-10-CM | POA: Diagnosis not present

## 2023-06-01 DIAGNOSIS — Z6829 Body mass index (BMI) 29.0-29.9, adult: Secondary | ICD-10-CM | POA: Diagnosis not present

## 2023-06-01 DIAGNOSIS — Z0000 Encounter for general adult medical examination without abnormal findings: Secondary | ICD-10-CM | POA: Diagnosis not present

## 2023-06-01 DIAGNOSIS — I471 Supraventricular tachycardia, unspecified: Secondary | ICD-10-CM | POA: Diagnosis not present

## 2023-06-01 DIAGNOSIS — K5120 Ulcerative (chronic) proctitis without complications: Secondary | ICD-10-CM | POA: Diagnosis not present

## 2023-06-01 DIAGNOSIS — E039 Hypothyroidism, unspecified: Secondary | ICD-10-CM | POA: Diagnosis not present

## 2023-06-01 DIAGNOSIS — Z79899 Other long term (current) drug therapy: Secondary | ICD-10-CM | POA: Diagnosis not present

## 2023-06-01 DIAGNOSIS — E7800 Pure hypercholesterolemia, unspecified: Secondary | ICD-10-CM | POA: Diagnosis not present

## 2023-06-02 ENCOUNTER — Ambulatory Visit
Admission: RE | Admit: 2023-06-02 | Discharge: 2023-06-02 | Disposition: A | Discharge status: Home / Self Care | Payer: Medicare HMO | Source: Ambulatory Visit | Attending: Family Medicine | Admitting: Family Medicine

## 2023-06-02 DIAGNOSIS — Z1231 Encounter for screening mammogram for malignant neoplasm of breast: Secondary | ICD-10-CM

## 2024-02-01 DIAGNOSIS — Z23 Encounter for immunization: Secondary | ICD-10-CM | POA: Diagnosis not present

## 2024-03-19 DIAGNOSIS — Z961 Presence of intraocular lens: Secondary | ICD-10-CM | POA: Diagnosis not present

## 2024-03-27 DIAGNOSIS — L820 Inflamed seborrheic keratosis: Secondary | ICD-10-CM | POA: Diagnosis not present

## 2024-03-27 DIAGNOSIS — Z1283 Encounter for screening for malignant neoplasm of skin: Secondary | ICD-10-CM | POA: Diagnosis not present

## 2024-03-27 DIAGNOSIS — D225 Melanocytic nevi of trunk: Secondary | ICD-10-CM | POA: Diagnosis not present

## 2024-03-27 DIAGNOSIS — D485 Neoplasm of uncertain behavior of skin: Secondary | ICD-10-CM | POA: Diagnosis not present

## 2024-04-06 DIAGNOSIS — D485 Neoplasm of uncertain behavior of skin: Secondary | ICD-10-CM | POA: Diagnosis not present

## 2024-04-06 DIAGNOSIS — L988 Other specified disorders of the skin and subcutaneous tissue: Secondary | ICD-10-CM | POA: Diagnosis not present

## 2024-04-06 DIAGNOSIS — L570 Actinic keratosis: Secondary | ICD-10-CM | POA: Diagnosis not present

## 2024-04-06 DIAGNOSIS — X32XXXD Exposure to sunlight, subsequent encounter: Secondary | ICD-10-CM | POA: Diagnosis not present

## 2024-04-18 ENCOUNTER — Other Ambulatory Visit: Payer: Self-pay | Admitting: Family Medicine

## 2024-04-18 DIAGNOSIS — Z1231 Encounter for screening mammogram for malignant neoplasm of breast: Secondary | ICD-10-CM

## 2024-06-12 ENCOUNTER — Ambulatory Visit
Admission: RE | Admit: 2024-06-12 | Discharge: 2024-06-12 | Disposition: A | Discharge status: Home / Self Care | Source: Ambulatory Visit | Attending: Family Medicine | Admitting: Family Medicine

## 2024-06-12 DIAGNOSIS — Z1231 Encounter for screening mammogram for malignant neoplasm of breast: Secondary | ICD-10-CM

## 2024-06-13 DIAGNOSIS — R69 Illness, unspecified: Secondary | ICD-10-CM | POA: Diagnosis not present

## 2024-06-25 DIAGNOSIS — E7800 Pure hypercholesterolemia, unspecified: Secondary | ICD-10-CM | POA: Diagnosis not present

## 2024-06-25 DIAGNOSIS — E039 Hypothyroidism, unspecified: Secondary | ICD-10-CM | POA: Diagnosis not present

## 2024-06-25 DIAGNOSIS — Z79899 Other long term (current) drug therapy: Secondary | ICD-10-CM | POA: Diagnosis not present

## 2024-07-05 DIAGNOSIS — E039 Hypothyroidism, unspecified: Secondary | ICD-10-CM | POA: Diagnosis not present

## 2024-07-05 DIAGNOSIS — Z0000 Encounter for general adult medical examination without abnormal findings: Secondary | ICD-10-CM | POA: Diagnosis not present

## 2024-07-05 DIAGNOSIS — Z6829 Body mass index (BMI) 29.0-29.9, adult: Secondary | ICD-10-CM | POA: Diagnosis not present

## 2024-07-05 DIAGNOSIS — E7800 Pure hypercholesterolemia, unspecified: Secondary | ICD-10-CM | POA: Diagnosis not present

## 2024-07-05 DIAGNOSIS — I4719 Other supraventricular tachycardia: Secondary | ICD-10-CM | POA: Diagnosis not present

## 2024-08-22 ENCOUNTER — Encounter: Payer: Self-pay | Admitting: Cardiology

## 2024-08-22 ENCOUNTER — Ambulatory Visit: Attending: Cardiology | Admitting: Cardiology

## 2024-08-22 VITALS — BP 152/88 | HR 78 | Resp 16 | Ht 64.0 in | Wt 160.0 lb

## 2024-08-22 DIAGNOSIS — E039 Hypothyroidism, unspecified: Secondary | ICD-10-CM

## 2024-08-22 DIAGNOSIS — K51919 Ulcerative colitis, unspecified with unspecified complications: Secondary | ICD-10-CM | POA: Diagnosis not present

## 2024-08-22 DIAGNOSIS — I471 Supraventricular tachycardia, unspecified: Secondary | ICD-10-CM

## 2024-08-22 DIAGNOSIS — E785 Hyperlipidemia, unspecified: Secondary | ICD-10-CM

## 2024-08-22 NOTE — Patient Instructions (Signed)
 Medication Instructions:  The current medical regimen is effective;  continue present plan and medications.  *If you need a refill on your cardiac medications before your next appointment, please call your pharmacy*  Follow-Up: At [facility], you and your health needs are our priority.  As part of our continuing mission to provide you with exceptional heart care, our providers are all part of one team.  This team includes your primary Cardiologist (physician) and Advanced Practice Providers or APPs (Physician Assistants and Nurse Practitioners) who all work together to provide you with the care you need, when you need it.  Your next appointment:   2 year(s)  Provider:   Donato Schultz, MD     We recommend signing up for the patient portal called "MyChart".  Sign up information is provided on this After Visit Summary.  MyChart is used to connect with patients for Virtual Visits (Telemedicine).  Patients are able to view lab/test results, encounter notes, upcoming appointments, etc.  Non-urgent messages can be sent to your provider as well.   To learn more about what you can do with MyChart, go to [email or web address].

## 2024-08-22 NOTE — Progress Notes (Signed)
 Cardiology Office Note:  .   Date:  08/22/2024  ID:  Jennifer Sexton, Jennifer Sexton [birth year], MRN [id number] PCP: Okey Carlin Redbird, MD  [facility] Providers Cardiologist:  Oneil Parchment, MD    History of Present Illness: .   Jennifer Sexton is a 73 y.o. female Discussed the use of AI scribe software for clinical note transcription with the patient, who gave verbal consent to proceed.  History of Present Illness Jennifer Sexton is a 73 year old female with paroxysmal supraventricular tachycardia who presents for follow-up.  She has paroxysmal supraventricular tachycardia (PSVT). Rare premature atrial contractions (PACs) were observed on a Holter monitor. She was previously on metoprolol 50 mg once a day, which was stopped due to fatigue, and is now on diltiazem  CD 180 mg once a day. She reports improved energy levels since the change in medication. No shortness of breath, chest pain, or fainting.  She has a history of hypothyroidism, which is well-controlled with Synthroid . Her TSH level is 0.67.  She has a history of ulcerative colitis and reports that she occasionally smoked zero to two cigarettes a day to help manage her symptoms. However, she has not smoked since June 19th, nearly three months ago, and her ulcerative colitis symptoms have not returned.  Her coronary calcium  score in 2023 was 21, placing her in the 48th percentile. She is on rosuvastatin  20 mg daily, with an LDL level of 92.  She reports a morning routine of coughing with two to three dry heaves, which she attributes to sinus drainage. This does not occur throughout the day and does not result in actual vomiting.  She remains physically active, walking regularly, and often outpaces her walking companions.  Unfortunately her husband Tom died in 03-07-2024.      Studies Reviewed: SABRA   EKG Interpretation Date/Time:  Wednesday August 22 2024 08:31:43 EDT Ventricular Rate:  73 PR Interval:  162 QRS Duration:  88 QT  Interval:  362 QTC Calculation: 398 R Axis:   12  Text Interpretation: Normal sinus rhythm Normal ECG When compared with ECG of 09-Sep-2008 09:20, PR interval has decreased Vent. 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) Risk Assessment/Calculations:           Physical Exam:   VS:  BP (!) 152/88 (BP Location: Left Arm, Patient Position: Sitting, Cuff Size: Normal)   Pulse 78   Resp 16   Ht 5' 4 (1.626 m)   Wt 160 lb (72.6 kg)   SpO2 94%   BMI 27.46 kg/m    Wt Readings from Last 3 Encounters:  08/22/24 160 lb (72.6 kg)  07/27/22 163 lb (73.9 kg)  08/05/20 166 lb (75.3 kg)    GEN: Well nourished, well developed in no acute distress NECK: No JVD; No carotid bruits CARDIAC: RRR, no murmurs, no rubs, no gallops RESPIRATORY:  Clear to auscultation without rales, wheezing or rhonchi  ABDOMEN: Soft, non-tender, non-distended EXTREMITIES:  No edema; No deformity   ASSESSMENT AND PLAN: .    Assessment and Plan Assessment & Plan Paroxysmal supraventricular tachycardia (PSVT) PSVT is well-managed with no adverse arrhythmias on Holter monitor. Previous switch from metoprolol to diltiazem  due to fatigue has improved energy  levels. Blood pressure is well-controlled on current regimen. - Continue diltiazem  CD 180 mg once daily.  Hyperlipidemia Hyperlipidemia is managed with rosuvastatin  20 mg daily. LDL is at 92, within acceptable range. Coronary calcium  score from 2023 was 21, indicating calcified plaque in the left anterior descending artery, but overall cardiovascular risk is effectively managed. Regular physical activity is encouraged to maintain cardiovascular health. - Continue  rosuvastatin  20 mg daily. - Encourage regular physical activity, such as walking.  Hypothyroidism Hypothyroidism is well-controlled with Synthroid . Recent TSH level is 0.67, indicating stable thyroid  function. - Continue Synthroid  as prescribed.         Dispo: 2 yrs  Signed, Oneil Parchment, MD

## 2024-10-17 DIAGNOSIS — X32XXXD Exposure to sunlight, subsequent encounter: Secondary | ICD-10-CM | POA: Diagnosis not present

## 2024-10-17 DIAGNOSIS — L570 Actinic keratosis: Secondary | ICD-10-CM | POA: Diagnosis not present

## 2024-10-17 DIAGNOSIS — Z1283 Encounter for screening for malignant neoplasm of skin: Secondary | ICD-10-CM | POA: Diagnosis not present

## 2024-10-17 DIAGNOSIS — D225 Melanocytic nevi of trunk: Secondary | ICD-10-CM | POA: Diagnosis not present

## 2024-10-21 DIAGNOSIS — S0501XA Injury of conjunctiva and corneal abrasion without foreign body, right eye, initial encounter: Secondary | ICD-10-CM | POA: Diagnosis not present

## 2024-10-21 DIAGNOSIS — H1031 Unspecified acute conjunctivitis, right eye: Secondary | ICD-10-CM | POA: Diagnosis not present

## 2024-11-01 DIAGNOSIS — M545 Low back pain, unspecified: Secondary | ICD-10-CM | POA: Diagnosis not present
